# Patient Record
Sex: Female | Born: 1965 | Race: White | Hispanic: No | Marital: Married | Smoking: Never smoker
Health system: Southern US, Community
[De-identification: ages and names within clinical notes are randomized; demographics above are authoritative.]

## PROBLEM LIST (undated history)

## (undated) DIAGNOSIS — I10 Essential (primary) hypertension: Secondary | ICD-10-CM

## (undated) DIAGNOSIS — Z9889 Other specified postprocedural states: Secondary | ICD-10-CM

## (undated) DIAGNOSIS — R112 Nausea with vomiting, unspecified: Secondary | ICD-10-CM

## (undated) HISTORY — PX: ABDOMINAL HYSTERECTOMY: SHX81

## (undated) HISTORY — PX: MYOMECTOMY: SHX85

---

## 1999-02-28 ENCOUNTER — Other Ambulatory Visit: Admission: RE | Admit: 1999-02-28 | Discharge: 1999-02-28 | Payer: Self-pay | Admitting: Obstetrics and Gynecology

## 2000-05-19 ENCOUNTER — Other Ambulatory Visit: Admission: RE | Admit: 2000-05-19 | Discharge: 2000-05-19 | Payer: Self-pay | Admitting: Obstetrics and Gynecology

## 2001-07-27 ENCOUNTER — Other Ambulatory Visit: Admission: RE | Admit: 2001-07-27 | Discharge: 2001-07-27 | Payer: Self-pay | Admitting: Obstetrics and Gynecology

## 2002-12-22 ENCOUNTER — Other Ambulatory Visit: Admission: RE | Admit: 2002-12-22 | Discharge: 2002-12-22 | Payer: Self-pay | Admitting: Obstetrics and Gynecology

## 2004-04-18 ENCOUNTER — Encounter: Admission: RE | Admit: 2004-04-18 | Discharge: 2004-05-23 | Payer: Self-pay | Admitting: Family Medicine

## 2004-11-05 ENCOUNTER — Other Ambulatory Visit: Admission: RE | Admit: 2004-11-05 | Discharge: 2004-11-05 | Payer: Self-pay | Admitting: Obstetrics and Gynecology

## 2008-03-30 ENCOUNTER — Encounter: Admission: RE | Admit: 2008-03-30 | Discharge: 2008-03-30 | Payer: Self-pay | Admitting: Obstetrics and Gynecology

## 2008-07-19 ENCOUNTER — Inpatient Hospital Stay (HOSPITAL_COMMUNITY): Admission: RE | Admit: 2008-07-19 | Discharge: 2008-07-21 | Payer: Self-pay | Admitting: Obstetrics and Gynecology

## 2008-07-19 ENCOUNTER — Encounter (INDEPENDENT_AMBULATORY_CARE_PROVIDER_SITE_OTHER): Payer: Self-pay | Admitting: Obstetrics and Gynecology

## 2008-08-13 ENCOUNTER — Inpatient Hospital Stay (HOSPITAL_COMMUNITY): Admission: AD | Admit: 2008-08-13 | Discharge: 2008-08-17 | Payer: Self-pay | Admitting: Obstetrics and Gynecology

## 2010-11-23 ENCOUNTER — Encounter: Payer: Self-pay | Admitting: Obstetrics and Gynecology

## 2011-03-17 NOTE — Op Note (Signed)
NAMEJERILEE, SPACE [name]:  [id number]   MEDICAL RECORD [name]:  [id number]          PATIENT TYPE:  AMB   LOCATION:  SDC                           FACILITY:  [facility]   PHYSICIAN:  Tammy Munoz. Tammy Munoz, M.D.DATE OF BIRTH:  1966/06/27   DATE OF PROCEDURE:  DATE OF DISCHARGE:                               OPERATIVE REPORT   PREOPERATIVE DIAGNOSIS:  Symptomatic leiomyoma.   POSTOPERATIVE DIAGNOSIS:  Symptomatic leiomyoma.   PROCEDURE:  TAH.   SURGEON:  Tammy Munoz. Tammy Munoz, M.D.   ASSISTANT:  Tammy Cairo, MD   ANESTHESIA:  General.   COMPLICATIONS:  None.   DRAINS:  Foley catheter.   BLOOD LOSS:  300 mL.   PROCEDURE AND FINDINGS:  The patient was taken to the operating room  after an adequate level of general endotracheal anesthesia was obtained  with the patient's legs flat.  The abdomen and vagina were prepped  separately, and she was draped.  Transverse incision made through the  old Pfannenstiel scar prior this Foley catheter was positioned.  Incision carried down to the fascia, which was incised and then extended  transversely.  Rectus muscle divided in the midline.  Peritoneum entered  superiorly without incident and extended in a vertical manner.  Uterus  approximately 16 weeks size with multiple irregular fibroids was  encountered.  The uterus then delivered through the incision.  Both  tubes and ovaries were normal and her desire was to have them conserved.  Large Kelly clamps was then placed to the each utero-ovarian pedicle  starting on the left.  The round ligament was coagulated and divided.  The anterior peritoneum carried around to the opposite round ligament  and the bladder was advanced below with sharp and blunt dissection.  The  utero-ovarian pedicle on the left was then coagulated and divided.  Once  this was separated free, the uterine vasculature pedicle was  skeletonized after assuring that the bladder was well below.  The  uterine artery  was then coagulated and divided close to the uterus.  On  the opposite side, the same procedure performed divided in the round  ligament, securing the right utero-ovarian pedicle, conserving the right  tube and ovary.  The right uterine arteries then coagulated and divided  with the LigaSure.  After this was accomplished, due to the bulk of the  uterus, it was excised to allow better visualization.  This was passed  off the field.  O'Connor-O'Sullivan retractor was then positioned.  Bowels packed superiorly out of the field.  Cervical stump was then  grasped with a tenaculum.  The bladder was dissected below the  cervicovaginal junction.  The remaining pedicles were then clamped,  divided, and suture-ligated with 0-Vicryl suture and the cervix was thus  excised.  Cuff was then closed with interrupted 2-0 Vicryl figure-of-  eight sutures.  The pelvis was then irrigated with saline and aspirated.  The operative site was inspected and noted to be hemostatic.  Prior to  closure sponge, needle, and instrument counts reported as correct x2.  Peritoneum closed with a running 2-0 Vicryl suture.  Rectus muscles  reapproximated with 2-0 Vicryl  interrupted sutures.  Fascia closed from  laterally to midline on either side with 0-PDS suture.  Subfascial and  subcutaneous, On-Q catheter was positioned.  The subcutaneous tissue was  hemostatic.  Clips and Steri-Strips used on the skin along with a  pressure dressing.  Clear urine noted at the end of the case.  She  tolerated this well, went to recovery room in good condition.  Prior to  the procedure x specimens removed, uterine fundus and cervix.      Tammy Munoz, M.D.  Electronically Signed     RMH/MEDQ  D:  07/19/2008  T:  07/19/2008  Job:  [id number]

## 2011-03-17 NOTE — Consult Note (Signed)
NAMEROYAL, BEIRNE [name]:  [id number]   MEDICAL RECORD [name]:  [id number]          PATIENT TYPE:  INP   LOCATION:  9372                          FACILITY:  [facility]   PHYSICIAN:  Lennie Muckle, MD      DATE OF BIRTH:  [birth year]   DATE OF CONSULTATION:  08/13/2008  DATE OF DISCHARGE:                                 CONSULTATION   REASON FOR CONSULTATION:  Small bowel obstruction.   HISTORY OF PRESENT ILLNESS:  Ms. Satterwhite is a 45 year old female who  apparently had a total abdominal hysterectomy on July 19, 2008, for  fibroids.  She had had a viral syndrome approximately 1-2 weeks after  surgery but otherwise had done well until Saturday.  She began having  harsh severe crampy abdominal pain.  She then began having nausea and  vomiting.  She did have crescendo-type of pain.  There were no  alleviating or aggravating factors.  The pain and emesis persisted today  with 3 episodes of emesis.  No bowel movement or flatus since Saturday.  She did have some abdominal distention previously.  As described by her  husband, she did have a few crampy pains about 2 weeks ago. No chills,  no fevers, and has not noticed any abnormal swelling in her abdomen  other than her previous umbilical hernia which has been present.  She  had a KUB which reveled small bowel loops.  A CT scan was performed  which revealed a small bowel obstruction.  There was a question of  hernia near the incision site.  I reviewed this.  There was not a  significant amount of fluid.  There was an umbilical hernia containing  fat.   PAST MEDICAL HISTORY:  She had had fibroids.  Takes no medicines.   PAST SURGICAL HISTORY:  Myomectomy and then she recently had a total  abdominal hysterectomy.   MEDICATIONS:  Iron.   ALLERGIES:  PENICILLIN causes rash.   SOCIAL HISTORY:  No tobacco or alcohol use.  She is married.   FAMILY HISTORY:  Noncontributory.   PHYSICAL EXAMINATION:  GENERAL:  She is a  well-developed, well-nourished  female in no acute distress.  VITAL SIGNS:  Temperature 97.7, blood pressure 153/93, heart rate 91.  HEENT:  Head is normocephalic.  Extraocular muscles are intact.  CHEST:  Clear to auscultation bilaterally.  CARDIOVASCULAR:  Regular, rate, and rhythm.  ABDOMEN:  Soft, nontender, and nondistended and appropriate incisional  tenderness at her lower abdominal scar.  There is an umbilical hernia  which is noted.  No appreciable hernia in and around the incision and  her lower abdomen.  No bowel sounds are auscultated.  CT scan is  reviewed by me.  There are dilated loops of intestine.  I do not see a  hernia down the incision in the lower abdomen.  There is an umbilical  hernia containing fat.  No significant amount of fluid within the  abdominal cavity.   LABORATORY DATA:  She has had a white count which was normal at 10,  hemoglobin and hematocrit 12 and 40.  Sodium 134, potassium 3.4, BUN  and  creatinine 16 and 1.3.   ASSESSMENT AND PLAN:  Small bowel obstruction, likely from adhesion of  tissue.  I do not see significant finding on her CT or examination for  an incisional hernia.  I would recommend NG tube decompression with  bowel rest.  Would like her ambulate as much as possible.  She can have  hard candy p.o. for comfort measures.  At present time, there is no  significant findings worrisome to indicate operative management as  needed acutely.  I discussed with Ms. Tapanes and her husband that  normally I would give time to allow for this to resolve.  Hopefully, she  will begin to open with the decompression and not need surgical  management, however, she does not have an improvement over the next 5-7  days, she may need to have an intervention.  I have discussed this with  Dr. Henderson Cloud and we will continue to follow the patient.      Lennie Muckle, MD  Electronically Signed     ALA/MEDQ  D:  08/13/2008  T:  08/14/2008  Job:  [id number]

## 2011-03-17 NOTE — Discharge Summary (Signed)
[name], Tammy [name]:  [id number]   MEDICAL RECORD [name]:  [id number]          PATIENT TYPE:  INP   LOCATION:  9317                          FACILITY:  [facility]   PHYSICIAN:  Duke Salvia. Marcelle Overlie, M.D.DATE OF BIRTH:  [birth year]   DATE OF ADMISSION:  07/19/2008  DATE OF DISCHARGE:  07/21/2008                               DISCHARGE SUMMARY   DISCHARGE DIAGNOSES:  1. Symptomatic leiomyoma.  2. Total abdominal hysterectomy this admission.   SUMMARY OF THE HISTORY AND PHYSICAL EXAM:  Please see admission H and P  for details.  Briefly, a 45 year old with large symptomatic leiomyoma  and anemia presents for definitive hysterectomy.   HOSPITAL COURSE:  On July 19, 2008, under general anesthesia, the  patient underwent laparotomy with TAH.  Her preop hemoglobin was 8.4.  On the first postop day, catheter was removed.  Her diet was advanced  and she was ambulating.  Her postop day #1 hemoglobin was 6.5.  She was  ambulating without orthostatic concerns at that point.  Second postop  day, followup hemoglobin 6.0.  Her abdominal exam was unremarkable.  The  incision was clean and dry.  On-Q catheters were removed.  Again, she  was ambulating without significant orthostasis as iron had been started.  She was ready for discharge at that point.   LAB DATA:  CBC on admission, July 17, 2008, WBC is 6.5, hemoglobin  8.4, hematocrit 27.2, platelets 453,000, blood type is A positive,  antibody screen negative, and EPT was negative.  CBC on July 20, 2008, WBC 12.4, hemoglobin 6.5, hematocrit 20.8, hemoglobin on July 21, 2008, 6.0.   DISPOSITION:  The patient was discharged on Ferralet 90 once daily,  Motrin 800 mg 1 p.o. q.12 h. p.r.n. pain, and Tylox p.r.n. pain.  Advised to report any incisional redness or drainage, increased pain or  bleeding, or fever over 101.  Was given specific instructions regarding  diet besides exercise and to be observant of any  significant orthostatic  changes.  I will see her back in the office in 3-4 days for an  incisional check and to have the clips removed.   CONDITION:  Good.   ACTIVITY:  Graded increase.      Richard M. Marcelle Overlie, M.D.  Electronically Signed     RMH/MEDQ  D:  07/21/2008  T:  07/22/2008  Job:  [id number]

## 2011-03-17 NOTE — H&P (Signed)
NAMETAMBI, THOLE [name]:  [id number]   MEDICAL RECORD [name]:  [id number]          PATIENT TYPE:  AMB   LOCATION:  SDC                           [facility]   PHYSICIAN:  Duke Salvia. Marcelle Overlie, M.D.DATE OF BIRTH:  [birth year]   DATE OF ADMISSION:  DATE OF DISCHARGE:                              HISTORY & PHYSICAL   CHIEF COMPLAINT:  Symptomatic leiomyoma.   HISTORY OF PRESENT ILLNESS:  A 45 year old G3, P2, 2 previous cesarean  sections, currently on Ortho-Novum 7/7/7, who has had worsening problems  with menorrhagia and increased pelvic pain associated with leiomyoma.  In 1995, she underwent myomectomy, since had 2 cesarean sections with  lysis of adhesions.   Most recent ultrasound as her symptoms worsened on April 2008  demonstrated multiple fibroids 6.7, 6.0, 5.1, 4.8, 4.6, and smaller.  She presents now for definitive TAH.  Procedure including risks related  to bleeding, infection, adjacent organ injury, the possible need for USO  if significant unilateral adnexal adhesions for example are encountered  and reviewed with her.   Other risks related to blood transfusion, wound infection, phlebitis  along with her expected recovery time, all reviewed which she  understands and accepts.   PAST MEDICAL HISTORY:   ALLERGIES:  PENICILLIN.   OPERATIONS:  Myomectomy, cesarean section x2.   FAMILY HISTORY:  Significant for history of diabetes and asthma.   PHYSICAL EXAMINATION:  VITAL SIGNS:  Temperature 98.2 and blood pressure  120/78.  HEENT:  Unremarkable.  NECK:  Supple without masses.  LUNGS:  Clear.  CARDIOVASCULAR:  Regular rate and rhythm without murmurs, rubs, or  gallops.  BREASTS:  Without masses.  ABDOMEN:  Soft and nontender.  PELVIC:  Normal external genitalia.  Vagina and cervix were clear.  Uterus was 15-16 weeks size.  Adnexa unremarkable.  EXTREMITIES:  Unremarkable  NEUROLOGIC:  Unremarkable.   IMPRESSION:  Symptomatic leiomyoma.   PLAN:  TAH.  Procedure and risks reviewed as above, full bowel prep has  been ordered preop.      Richard M. Marcelle Overlie, M.D.  Electronically Signed     RMH/MEDQ  D:  07/16/2008  T:  07/17/2008  Job:  [id number]

## 2011-03-17 NOTE — Discharge Summary (Signed)
[name], ZANDERS [name]:  [id number]   MEDICAL RECORD [name]:  [id number]          PATIENT TYPE:  INP   LOCATION:  9302                          [facility]ILITY[facility]   PHYSICIAN:  Duke Salvia. Marcelle Overlie, M.D.DATE OF BIRTH:  May 24, 1966   DATE OF ADMISSION:  08/13/2008  DATE OF DISCHARGE:  08/17/2008                               DISCHARGE SUMMARY   DISCHARGE DIAGNOSIS:  Postoperative small bowel obstruction, resolved.   Summary of the history and physical exam, please see admission H and P  for details. Briefly, 45 year old white female status post TAH July 19, 2008, approximately 3 weeks postop.  A week ago, she did have a  viral syndrome with viral gastroenteritis with spontaneous improvement  on the day of admission; however experienced, nausea, vomiting, that was  worsening with increasing abdominal pain and was sent to MAU for further  evaluation.   HOSPITAL COURSE:  An MAU was noted to have a temperature of 98.2,  decreased bowel sounds were noted with distention, WBC 10.0, hemoglobin  12.6.  Flattened upright abdominal film showed the SBO.  CT was ordered  showing high-grade S B O.  She was admitted for further management  including consultation with general surgery who recommended conservative  approach initially with nasal decompression.  Her NG tube produced  fairly immediate results with marked improvement in her abdominal  cramping.  Over the next few days, she had spontaneous flatus and bowel  movements.  Remained afebrile with an improving abdominal exam.  On the  followup abdominal film dated August 15, 2008, she had improving small  bowel obstruction since the previous exam.  NG tube was placed to  straight drain, was tolerated well was removed 2 days prior to  discharge.  The day prior to discharge diet was advanced.  She was  tolerating a regular diet in the morning of August 17, 2008.  No  abdominal complaints and was ready for discharge at that  point.   LABORATORY DATA:  CBC August 13, 2008, WBC 10.9, hemoglobin 12.6,  hematocrit 40.7, platelet 615,000.  CMET was normal.  UA negative.  Admission CT high-grade bowel small-bowel obstruction with transition  point in the pelvis.  No evidence of pneumoperitoneum.   DISPOSITION:  The patient discharged on regular diet.  She is taking  Motrin p.r.n. pain only advised to report any recurrent symptoms,  nausea, vomiting, abdominal pain, or obstipation.  She will return the  office in 1 week, other normal postop hysterectomy instructions were  reviewed.  Condition is good.   ACTIVITY:  Gradual increase.      Richard M. Marcelle Overlie, M.D.  Electronically Signed     RMH/MEDQ  D:  08/17/2008  T:  08/17/2008  Job:  [id number]

## 2011-08-03 LAB — CBC
HCT: 27.2 — ABNORMAL LOW
Hemoglobin: 6 — CL
Hemoglobin: 8.4 — ABNORMAL LOW
MCHC: 31.1
MCV: 64.9 — ABNORMAL LOW
Platelets: 386
Platelets: 416 — ABNORMAL HIGH
RBC: 2.96 — ABNORMAL LOW
RDW: 18.1 — ABNORMAL HIGH
WBC: 6.5
WBC: 7.2

## 2011-08-03 LAB — ABO/RH: ABO/RH(D): A POS

## 2011-08-03 LAB — TYPE AND SCREEN
ABO/RH(D): A POS
Antibody Screen: NEGATIVE

## 2011-08-03 LAB — PREGNANCY, URINE: Preg Test, Ur: NEGATIVE

## 2011-08-04 LAB — URINALYSIS, ROUTINE W REFLEX MICROSCOPIC
Hgb urine dipstick: NEGATIVE
Ketones, ur: 15 — AB
Leukocytes, UA: NEGATIVE
Nitrite: NEGATIVE
Protein, ur: 30 — AB
Urobilinogen, UA: 0.2
pH: 5

## 2011-08-04 LAB — COMPREHENSIVE METABOLIC PANEL
ALT: 24
AST: 19
Albumin: 4.1
BUN: 16
CO2: 30
Calcium: 9.4
GFR calc Af Amer: >60
GFR calc non Af Amer: 53 — ABNORMAL LOW
Potassium: 3.7
Potassium: 3.9
Sodium: 134 — ABNORMAL LOW
Total Bilirubin: 0.6
Total Bilirubin: 0.7

## 2011-08-04 LAB — CBC
Hemoglobin: 12.6
MCHC: 31.1
MCV: 72.4 — ABNORMAL LOW
MCV: 72.6 — ABNORMAL LOW
RBC: 5.15 — ABNORMAL HIGH
RDW: 27.7 — ABNORMAL HIGH
WBC: 10
WBC: 6

## 2011-08-04 LAB — URINE MICROSCOPIC-ADD ON

## 2016-04-17 DIAGNOSIS — B373 Candidiasis of vulva and vagina: Secondary | ICD-10-CM | POA: Diagnosis not present

## 2016-04-17 DIAGNOSIS — I10 Essential (primary) hypertension: Secondary | ICD-10-CM | POA: Diagnosis not present

## 2016-04-17 DIAGNOSIS — N951 Menopausal and female climacteric states: Secondary | ICD-10-CM | POA: Diagnosis not present

## 2016-06-23 DIAGNOSIS — Z1231 Encounter for screening mammogram for malignant neoplasm of breast: Secondary | ICD-10-CM | POA: Diagnosis not present

## 2016-06-23 DIAGNOSIS — Z13820 Encounter for screening for osteoporosis: Secondary | ICD-10-CM | POA: Diagnosis not present

## 2016-06-23 DIAGNOSIS — Z01419 Encounter for gynecological examination (general) (routine) without abnormal findings: Secondary | ICD-10-CM | POA: Diagnosis not present

## 2016-06-23 DIAGNOSIS — Z6829 Body mass index (BMI) 29.0-29.9, adult: Secondary | ICD-10-CM | POA: Diagnosis not present

## 2016-06-29 ENCOUNTER — Other Ambulatory Visit: Payer: Self-pay | Admitting: Obstetrics and Gynecology

## 2016-06-29 DIAGNOSIS — R928 Other abnormal and inconclusive findings on diagnostic imaging of breast: Secondary | ICD-10-CM

## 2016-07-02 ENCOUNTER — Ambulatory Visit
Admission: RE | Admit: 2016-07-02 | Discharge: 2016-07-02 | Disposition: A | Discharge status: Home / Self Care | Payer: BLUE CROSS/BLUE SHIELD | Source: Ambulatory Visit | Attending: Obstetrics and Gynecology | Admitting: Obstetrics and Gynecology

## 2016-07-02 ENCOUNTER — Other Ambulatory Visit: Payer: Self-pay | Admitting: Obstetrics and Gynecology

## 2016-07-02 DIAGNOSIS — N63 Unspecified lump in breast: Secondary | ICD-10-CM | POA: Diagnosis not present

## 2016-07-02 DIAGNOSIS — R928 Other abnormal and inconclusive findings on diagnostic imaging of breast: Secondary | ICD-10-CM

## 2016-07-02 DIAGNOSIS — R921 Mammographic calcification found on diagnostic imaging of breast: Secondary | ICD-10-CM | POA: Diagnosis not present

## 2016-07-02 DIAGNOSIS — N6320 Unspecified lump in the left breast, unspecified quadrant: Secondary | ICD-10-CM

## 2016-07-03 ENCOUNTER — Other Ambulatory Visit: Payer: Self-pay | Admitting: Obstetrics and Gynecology

## 2016-07-03 DIAGNOSIS — N6320 Unspecified lump in the left breast, unspecified quadrant: Secondary | ICD-10-CM

## 2016-07-07 ENCOUNTER — Ambulatory Visit
Admission: RE | Admit: 2016-07-07 | Discharge: 2016-07-07 | Disposition: A | Discharge status: Home / Self Care | Payer: BLUE CROSS/BLUE SHIELD | Source: Ambulatory Visit | Attending: Obstetrics and Gynecology | Admitting: Obstetrics and Gynecology

## 2016-07-07 DIAGNOSIS — D242 Benign neoplasm of left breast: Secondary | ICD-10-CM | POA: Diagnosis not present

## 2016-07-07 DIAGNOSIS — N6320 Unspecified lump in the left breast, unspecified quadrant: Secondary | ICD-10-CM

## 2016-07-07 DIAGNOSIS — N63 Unspecified lump in breast: Secondary | ICD-10-CM | POA: Diagnosis not present

## 2016-07-21 ENCOUNTER — Ambulatory Visit: Payer: Self-pay | Admitting: General Surgery

## 2016-07-21 DIAGNOSIS — D242 Benign neoplasm of left breast: Secondary | ICD-10-CM | POA: Diagnosis not present

## 2016-07-30 ENCOUNTER — Other Ambulatory Visit: Payer: Self-pay | Admitting: General Surgery

## 2016-07-30 DIAGNOSIS — D242 Benign neoplasm of left breast: Secondary | ICD-10-CM

## 2016-08-04 ENCOUNTER — Encounter (HOSPITAL_BASED_OUTPATIENT_CLINIC_OR_DEPARTMENT_OTHER): Payer: Self-pay | Admitting: *Deleted

## 2016-08-05 DIAGNOSIS — Z23 Encounter for immunization: Secondary | ICD-10-CM | POA: Diagnosis not present

## 2016-08-06 ENCOUNTER — Encounter (HOSPITAL_BASED_OUTPATIENT_CLINIC_OR_DEPARTMENT_OTHER)
Admission: RE | Admit: 2016-08-06 | Discharge: 2016-08-06 | Disposition: A | Discharge status: Home / Self Care | Payer: BLUE CROSS/BLUE SHIELD | Source: Ambulatory Visit | Attending: General Surgery | Admitting: General Surgery

## 2016-08-06 ENCOUNTER — Other Ambulatory Visit: Payer: Self-pay

## 2016-08-06 DIAGNOSIS — I10 Essential (primary) hypertension: Secondary | ICD-10-CM | POA: Insufficient documentation

## 2016-08-06 NOTE — Pre-Procedure Instructions (Signed)
Boost Breeze 8 oz. given to pt. with instructions to drink by 0800 DOS.

## 2016-08-18 ENCOUNTER — Ambulatory Visit
Admission: RE | Admit: 2016-08-18 | Discharge: 2016-08-18 | Disposition: A | Discharge status: Home / Self Care | Payer: BLUE CROSS/BLUE SHIELD | Source: Ambulatory Visit | Attending: General Surgery | Admitting: General Surgery

## 2016-08-18 DIAGNOSIS — D242 Benign neoplasm of left breast: Secondary | ICD-10-CM

## 2016-08-19 ENCOUNTER — Ambulatory Visit (HOSPITAL_BASED_OUTPATIENT_CLINIC_OR_DEPARTMENT_OTHER)
Admission: RE | Admit: 2016-08-19 | Discharge: 2016-08-19 | Disposition: A | Discharge status: Home / Self Care | Payer: BLUE CROSS/BLUE SHIELD | Source: Ambulatory Visit | Attending: General Surgery | Admitting: General Surgery

## 2016-08-19 ENCOUNTER — Ambulatory Visit (HOSPITAL_BASED_OUTPATIENT_CLINIC_OR_DEPARTMENT_OTHER): Payer: BLUE CROSS/BLUE SHIELD | Admitting: Anesthesiology

## 2016-08-19 ENCOUNTER — Ambulatory Visit
Admission: RE | Admit: 2016-08-19 | Discharge: 2016-08-19 | Disposition: A | Discharge status: Home / Self Care | Payer: BLUE CROSS/BLUE SHIELD | Source: Ambulatory Visit | Attending: General Surgery | Admitting: General Surgery

## 2016-08-19 ENCOUNTER — Encounter (HOSPITAL_BASED_OUTPATIENT_CLINIC_OR_DEPARTMENT_OTHER)
Admission: RE | Disposition: A | Discharge status: Home / Self Care | Payer: Self-pay | Source: Ambulatory Visit | Attending: General Surgery

## 2016-08-19 ENCOUNTER — Encounter (HOSPITAL_BASED_OUTPATIENT_CLINIC_OR_DEPARTMENT_OTHER): Payer: Self-pay | Admitting: *Deleted

## 2016-08-19 DIAGNOSIS — N6022 Fibroadenosis of left breast: Secondary | ICD-10-CM | POA: Insufficient documentation

## 2016-08-19 DIAGNOSIS — D242 Benign neoplasm of left breast: Secondary | ICD-10-CM | POA: Diagnosis not present

## 2016-08-19 DIAGNOSIS — I10 Essential (primary) hypertension: Secondary | ICD-10-CM | POA: Insufficient documentation

## 2016-08-19 DIAGNOSIS — N6092 Unspecified benign mammary dysplasia of left breast: Secondary | ICD-10-CM | POA: Insufficient documentation

## 2016-08-19 DIAGNOSIS — Z803 Family history of malignant neoplasm of breast: Secondary | ICD-10-CM | POA: Diagnosis not present

## 2016-08-19 DIAGNOSIS — Z90710 Acquired absence of both cervix and uterus: Secondary | ICD-10-CM | POA: Diagnosis not present

## 2016-08-19 DIAGNOSIS — Z79899 Other long term (current) drug therapy: Secondary | ICD-10-CM | POA: Diagnosis not present

## 2016-08-19 DIAGNOSIS — N6082 Other benign mammary dysplasias of left breast: Secondary | ICD-10-CM | POA: Diagnosis not present

## 2016-08-19 DIAGNOSIS — N6012 Diffuse cystic mastopathy of left breast: Secondary | ICD-10-CM | POA: Diagnosis not present

## 2016-08-19 HISTORY — PX: BREAST LUMPECTOMY WITH RADIOACTIVE SEED LOCALIZATION: SHX6424

## 2016-08-19 HISTORY — DX: Essential (primary) hypertension: I10

## 2016-08-19 HISTORY — DX: Other specified postprocedural states: Z98.890

## 2016-08-19 HISTORY — DX: Nausea with vomiting, unspecified: R11.2

## 2016-08-19 SURGERY — BREAST LUMPECTOMY WITH RADIOACTIVE SEED LOCALIZATION
Anesthesia: General | Site: Breast | Laterality: Left

## 2016-08-19 MED ORDER — CHLORHEXIDINE GLUCONATE CLOTH 2 % EX PADS: 6.0000 | MEDICATED_PAD | Freq: Once | CUTANEOUS | Status: DC

## 2016-08-19 MED ORDER — SCOPOLAMINE 1 MG/3DAYS TD PT72: 1.0000 | MEDICATED_PATCH | Freq: Once | TRANSDERMAL | Status: DC | PRN

## 2016-08-19 MED ORDER — FENTANYL CITRATE (PF) 100 MCG/2ML IJ SOLN
INTRAMUSCULAR | Status: AC
  Filled 2016-08-19: qty 2

## 2016-08-19 MED ORDER — LACTATED RINGERS IV SOLN: INTRAVENOUS | Status: DC

## 2016-08-19 MED ORDER — VANCOMYCIN HCL IN DEXTROSE 1-5 GM/200ML-% IV SOLN
INTRAVENOUS | Status: AC
  Filled 2016-08-19: qty 200

## 2016-08-19 MED ORDER — MIDAZOLAM HCL 2 MG/2ML IJ SOLN
INTRAMUSCULAR | Status: AC
  Filled 2016-08-19: qty 2

## 2016-08-19 MED ORDER — PROMETHAZINE HCL 25 MG/ML IJ SOLN: 6.2500 mg | INTRAMUSCULAR | Status: DC | PRN

## 2016-08-19 MED ORDER — FENTANYL CITRATE (PF) 100 MCG/2ML IJ SOLN
50.0000 ug | INTRAMUSCULAR | Status: AC | PRN
  Administered 2016-08-19 (×2): 50 ug via INTRAVENOUS
  Administered 2016-08-19: 100 ug via INTRAVENOUS

## 2016-08-19 MED ORDER — ONDANSETRON HCL 4 MG/2ML IJ SOLN
INTRAMUSCULAR | Status: DC | PRN
  Administered 2016-08-19: 4 mg via INTRAVENOUS

## 2016-08-19 MED ORDER — LIDOCAINE HCL (CARDIAC) 20 MG/ML IV SOLN
INTRAVENOUS | Status: DC | PRN
  Administered 2016-08-19: 100 mg via INTRAVENOUS

## 2016-08-19 MED ORDER — BUPIVACAINE HCL (PF) 0.25 % IJ SOLN
INTRAMUSCULAR | Status: AC
Start: 2016-08-19 — End: 2016-08-19
  Filled 2016-08-19: qty 30

## 2016-08-19 MED ORDER — DEXAMETHASONE SODIUM PHOSPHATE 4 MG/ML IJ SOLN
INTRAMUSCULAR | Status: DC | PRN
  Administered 2016-08-19: 10 mg via INTRAVENOUS

## 2016-08-19 MED ORDER — OXYCODONE HCL 5 MG/5ML PO SOLN: 5.0000 mg | Freq: Once | ORAL | Status: DC | PRN

## 2016-08-19 MED ORDER — VANCOMYCIN HCL IN DEXTROSE 1-5 GM/200ML-% IV SOLN
1000.0000 mg | INTRAVENOUS | Status: AC
  Administered 2016-08-19: 1000 mg via INTRAVENOUS

## 2016-08-19 MED ORDER — MEPERIDINE HCL 25 MG/ML IJ SOLN: 6.2500 mg | INTRAMUSCULAR | Status: DC | PRN

## 2016-08-19 MED ORDER — MIDAZOLAM HCL 2 MG/2ML IJ SOLN
1.0000 mg | INTRAMUSCULAR | Status: DC | PRN
  Administered 2016-08-19: 2 mg via INTRAVENOUS

## 2016-08-19 MED ORDER — GLYCOPYRROLATE 0.2 MG/ML IJ SOLN: 0.2000 mg | Freq: Once | INTRAMUSCULAR | Status: DC | PRN

## 2016-08-19 MED ORDER — BUPIVACAINE HCL (PF) 0.25 % IJ SOLN
INTRAMUSCULAR | Status: DC | PRN
  Administered 2016-08-19: 20 mL

## 2016-08-19 MED ORDER — LACTATED RINGERS IV SOLN
INTRAVENOUS | Status: DC
Start: 2016-08-19 — End: 2016-08-19
  Administered 2016-08-19 (×2): via INTRAVENOUS

## 2016-08-19 MED ORDER — SUCCINYLCHOLINE CHLORIDE 20 MG/ML IJ SOLN
INTRAMUSCULAR | Status: DC | PRN
  Administered 2016-08-19: 60 mg via INTRAVENOUS

## 2016-08-19 MED ORDER — HYDROMORPHONE HCL 1 MG/ML IJ SOLN: 0.2500 mg | INTRAMUSCULAR | Status: DC | PRN

## 2016-08-19 MED ORDER — PROPOFOL 10 MG/ML IV BOLUS
INTRAVENOUS | Status: DC | PRN
  Administered 2016-08-19: 200 mg via INTRAVENOUS
  Administered 2016-08-19 (×2): 50 mg via INTRAVENOUS

## 2016-08-19 MED ORDER — OXYCODONE HCL 5 MG PO TABS: 5.0000 mg | ORAL_TABLET | Freq: Once | ORAL | Status: DC | PRN

## 2016-08-19 MED ORDER — OXYCODONE-ACETAMINOPHEN 5-325 MG PO TABS: 1.0000 | ORAL_TABLET | ORAL | 0 refills | Status: AC | PRN

## 2016-08-19 SURGICAL SUPPLY — 42 items
APPLIER CLIP 9.375 MED OPEN (MISCELLANEOUS)
BLADE SURG 15 STRL LF DISP TIS (BLADE) ×1 IMPLANT
BLADE SURG 15 STRL SS (BLADE) ×1
CANISTER SUC SOCK COL 7IN (MISCELLANEOUS) ×2 IMPLANT
CANISTER SUCT 1200ML W/VALVE (MISCELLANEOUS) ×2 IMPLANT
CHLORAPREP W/TINT 26ML (MISCELLANEOUS) ×2 IMPLANT
CLIP APPLIE 9.375 MED OPEN (MISCELLANEOUS) IMPLANT
COVER BACK TABLE 60X90IN (DRAPES) ×2 IMPLANT
COVER MAYO STAND STRL (DRAPES) ×2 IMPLANT
COVER PROBE W GEL 5X96 (DRAPES) ×2 IMPLANT
DECANTER SPIKE VIAL GLASS SM (MISCELLANEOUS) IMPLANT
DERMABOND ADVANCED (GAUZE/BANDAGES/DRESSINGS) ×1
DERMABOND ADVANCED .7 DNX12 (GAUZE/BANDAGES/DRESSINGS) ×1 IMPLANT
DEVICE DUBIN W/COMP PLATE 8390 (MISCELLANEOUS) ×2 IMPLANT
DRAPE LAPAROSCOPIC ABDOMINAL (DRAPES) IMPLANT
DRAPE UTILITY XL STRL (DRAPES) ×2 IMPLANT
ELECT COATED BLADE 2.86 ST (ELECTRODE) ×2 IMPLANT
ELECT REM PT RETURN 9FT ADLT (ELECTROSURGICAL) ×2
ELECTRODE REM PT RTRN 9FT ADLT (ELECTROSURGICAL) ×1 IMPLANT
GLOVE BIO SURGEON STRL SZ7.5 (GLOVE) ×4 IMPLANT
GLOVE BIOGEL PI IND STRL 7.0 (GLOVE) ×1 IMPLANT
GLOVE BIOGEL PI INDICATOR 7.0 (GLOVE) ×1
GLOVE ECLIPSE 6.5 STRL STRAW (GLOVE) ×2 IMPLANT
GOWN STRL REUS W/ TWL LRG LVL3 (GOWN DISPOSABLE) ×2 IMPLANT
GOWN STRL REUS W/TWL LRG LVL3 (GOWN DISPOSABLE) ×2
ILLUMINATOR WAVEGUIDE N/F (MISCELLANEOUS) IMPLANT
KIT MARKER MARGIN INK (KITS) ×2 IMPLANT
LIGHT WAVEGUIDE WIDE FLAT (MISCELLANEOUS) IMPLANT
NEEDLE HYPO 25X1 1.5 SAFETY (NEEDLE) IMPLANT
NS IRRIG 1000ML POUR BTL (IV SOLUTION) IMPLANT
PACK BASIN DAY SURGERY FS (CUSTOM PROCEDURE TRAY) ×2 IMPLANT
PENCIL BUTTON HOLSTER BLD 10FT (ELECTRODE) ×2 IMPLANT
SLEEVE SCD COMPRESS KNEE MED (MISCELLANEOUS) ×2 IMPLANT
SPONGE LAP 18X18 X RAY DECT (DISPOSABLE) ×2 IMPLANT
SUT MON AB 4-0 PC3 18 (SUTURE) IMPLANT
SUT SILK 2 0 SH (SUTURE) IMPLANT
SUT VICRYL 3-0 CR8 SH (SUTURE) ×2 IMPLANT
SYR CONTROL 10ML LL (SYRINGE) IMPLANT
TOWEL OR 17X24 6PK STRL BLUE (TOWEL DISPOSABLE) ×2 IMPLANT
TOWEL OR NON WOVEN STRL DISP B (DISPOSABLE) ×2 IMPLANT
TUBE CONNECTING 20X1/4 (TUBING) ×2 IMPLANT
YANKAUER SUCT BULB TIP NO VENT (SUCTIONS) IMPLANT

## 2016-08-19 NOTE — Op Note (Signed)
08/19/2016  12:06 PM  PATIENT:  Tammy Munoz  50 y.o. female  PRE-OPERATIVE DIAGNOSIS:  LEFT BREAST PAPILLOMA  POST-OPERATIVE DIAGNOSIS:  LEFT BREAST PAPILLOMA  PROCEDURE:  Procedure(s): LEFT BREAST LUMPECTOMY WITH RADIOACTIVE SEED LOCALIZATION (Left)  SURGEON:  Surgeon(s) and Role:    * Jovita Kussmaul, MD - Primary  PHYSICIAN ASSISTANT:   ASSISTANTS: none   ANESTHESIA:   local and general  EBL:  Total I/O In: 700 [I.V.:700] Out: 5 [Blood:5]  BLOOD ADMINISTERED:none  DRAINS: none   LOCAL MEDICATIONS USED:  MARCAINE     SPECIMEN:  Source of Specimen:  left breast tissue  DISPOSITION OF SPECIMEN:  PATHOLOGY  COUNTS:  YES  TOURNIQUET:  * No tourniquets in log *  DICTATION: .Dragon Dictation   After informed consent was obtained the patient was brought to the operating room and placed in the supine position on the operating room table. After adequate induction of general anesthesia the patient's left breast was prepped with ChloraPrep, allowed to dry, and draped in usual sterile manner. An appropriate timeout was performed. Previously an I-125 seed was placed in the outer aspect of the left breast to mark an area of an intraductal papilloma. The neoprobe was set to I-125 in the area of radioactivity was readily identified in the central lateral left breast. The area around the radioactivity was infiltrated with quarter percent Marcaine. A curvilinear incision was made with a 15 blade knife along the outer edge of the areola. The incision was carried through skin and subcutaneous tissue sharply with the electrocautery. The dissection was directed by the neoprobe. Once we approached the radioactive seed I then checked the area of radioactivity frequently and removed a circular portion of breast tissue sharply around the radioactive seed. Once the specimen was removed it was oriented with the appropriate paint colors. A specimen radiograph was obtained that showed the clip  in seed to be near the center of the specimen. The specimen was then sent to pathology for further evaluation. Hemostasis was achieved using the Bovie electrocautery. The wound was then infiltrated with quarter percent Marcaine and irrigated with saline. The deep layer of the wound was then closed with layers of interrupted 3-0 Vicryl stitches. The skin was then closed with interrupted 4-0 Monocryl subcuticular stitches. Dermabond dressings were applied. The patient tolerated the procedure well. At the end of the case all needle sponge counts were correct. The patient was then awakened and taken to recovery in stable condition.  PLAN OF CARE: Discharge to home after PACU  PATIENT DISPOSITION:  PACU - hemodynamically stable.   Delay start of Pharmacological VTE agent (>24hrs) due to surgical blood loss or risk of bleeding: not applicable

## 2016-08-19 NOTE — Anesthesia Preprocedure Evaluation (Addendum)
Anesthesia Evaluation  Patient identified by MRN, date of birth, ID band Patient awake    Reviewed: Allergy & Precautions, NPO status , Patient's Chart, lab work & pertinent test results  History of Anesthesia Complications (+) PONV and history of anesthetic complications  Airway Mallampati: II  TM Distance: >3 FB Neck ROM: Full    Dental  (+) Teeth Intact, Dental Advisory Given   Pulmonary neg pulmonary ROS,    breath sounds clear to auscultation       Cardiovascular hypertension, Pt. on medications  Rhythm:Regular Rate:Normal     Neuro/Psych negative neurological ROS  negative psych ROS   GI/Hepatic negative GI ROS, Neg liver ROS,   Endo/Other  negative endocrine ROS  Renal/GU negative Renal ROS  negative genitourinary   Musculoskeletal negative musculoskeletal ROS (+)   Abdominal   Peds negative pediatric ROS (+)  Hematology negative hematology ROS (+)   Anesthesia Other Findings   Reproductive/Obstetrics negative OB ROS                            Lab Results  Component Value Date   WBC 6.0 08/14/2008   HGB 11.6 (L) 08/14/2008   HCT 37.3 08/14/2008   MCV 72.4 (L) 08/14/2008   PLT 514 (H) 08/14/2008   Lab Results  Component Value Date   CREATININE 1.00 08/14/2008   BUN 16 08/14/2008   NA 134 (L) 08/14/2008   K 3.9 08/14/2008   CL 92 (L) 08/14/2008   CO2 30 08/14/2008   No results found for: INR, PROTIME  08/2016 EKG: normal sinus rhythm.  Anesthesia Physical Anesthesia Plan  ASA: II  Anesthesia Plan: General   Post-op Pain Management:    Induction: Intravenous  Airway Management Planned: LMA  Additional Equipment:   Intra-op Plan:   Post-operative Plan: Extubation in OR  Informed Consent: I have reviewed the patients History and Physical, chart, labs and discussed the procedure including the risks, benefits and alternatives for the proposed anesthesia  with the patient or authorized representative who has indicated his/her understanding and acceptance.   Dental advisory given  Plan Discussed with: CRNA  Anesthesia Plan Comments:         Anesthesia Quick Evaluation

## 2016-08-19 NOTE — Anesthesia Postprocedure Evaluation (Signed)
Anesthesia Post Note  Patient: Tammy Munoz  Procedure(s) Performed: Procedure(s) (LRB): LEFT BREAST LUMPECTOMY WITH RADIOACTIVE SEED LOCALIZATION (Left)  Patient location during evaluation: PACU Anesthesia Type: General Level of consciousness: awake and alert Pain management: pain level controlled Vital Signs Assessment: post-procedure vital signs reviewed and stable Respiratory status: spontaneous breathing, nonlabored ventilation, respiratory function stable and patient connected to nasal cannula oxygen Cardiovascular status: blood pressure returned to baseline and stable Postop Assessment: no signs of nausea or vomiting Anesthetic complications: no    Last Vitals:  Vitals:   08/19/16 1245 08/19/16 1253  BP:  134/70  Pulse: 80 78  Resp:    Temp:  36.8 C    Last Pain:  Vitals:   08/19/16 1253  TempSrc:   PainSc: 2                  Effie Berkshire

## 2016-08-19 NOTE — Transfer of Care (Signed)
Immediate Anesthesia Transfer of Care Note  Patient: Tammy Munoz  Procedure(s) Performed: Procedure(s): LEFT BREAST LUMPECTOMY WITH RADIOACTIVE SEED LOCALIZATION (Left)  Patient Location: PACU  Anesthesia Type:General  Level of Consciousness: awake, alert  and oriented  Airway & Oxygen Therapy: Patient Spontanous Breathing and Patient connected to face mask oxygen  Post-op Assessment: Report given to RN and Post -op Vital signs reviewed and stable  Post vital signs: Reviewed and stable  Last Vitals:  Vitals:   08/19/16 1014  BP: (!) 148/79  Pulse: 74  Resp: 20  Temp: 36.9 C    Last Pain:  Vitals:   08/19/16 1014  TempSrc: Oral         Complications: No apparent anesthesia complications

## 2016-08-19 NOTE — Anesthesia Procedure Notes (Signed)
Procedure Name: LMA Insertion Date/Time: 08/19/2016 11:27 AM Performed by: Melynda Ripple D Pre-anesthesia Checklist: Patient identified, Emergency Drugs available, Suction available and Patient being monitored Patient Re-evaluated:Patient Re-evaluated prior to inductionOxygen Delivery Method: Circle system utilized Preoxygenation: Pre-oxygenation with 100% oxygen Intubation Type: IV induction Ventilation: Mask ventilation without difficulty LMA: LMA inserted LMA Size: 4.0 Number of attempts: 1 Airway Equipment and Method: Bite block Placement Confirmation: positive ETCO2 Tube secured with: Tape Dental Injury: Teeth and Oropharynx as per pre-operative assessment

## 2016-08-19 NOTE — H&P (Signed)
Tammy Munoz  Location: [facility] Patient #: [id number] DOB: Sep 01, 1966 Married / Language: English / Race: White Female   History of Present Illness  The patient is a 50 year old female who presents with a breast mass. We are asked to see the patient in consultation by Dr. Dorise Bullion to evaluate her for a left breast papilloma. The patient is a 50 year old white female who recently went for a routine screening mammogram. At that time she was found to have a 1 cm area of distortion in the inner aspect of the left breast. 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 She has had several episodes of clear yellow nipple discharge over the last 6 months. She denies any breast pain. Her only family history of breast cancer is in a maternal aunt. She does not smoke. She does not take any hormone replacement.   Other Problems Back Pain High blood pressure Lump In Breast Oophorectomy  Past Surgical History Breast Biopsy Left. Cesarean Section - Multiple Hysterectomy (not due to cancer) - Complete Oral Surgery  Diagnostic Studies History Colonoscopy never Mammogram within last year Pap Smear 1-5 years ago  Allergies Penicillin VK *PENICILLINS* Hives.  Medication History  Amlodipine Besylate-Valsartan (10-160MG  Tablet, Oral) Active. Estradiol (0.025MG /24HR Patch Weekly, Transdermal) Active. Medications Reconciled  Pregnancy / Birth History Age at menarche 18 years. Contraceptive History Oral contraceptives. Gravida 3 Length (months) of breastfeeding 7-12 Maternal age 77-30 Para 2    Review of Systems  General Not Present- Appetite Loss, Chills, Fatigue, Fever, Night Sweats, Weight Gain and Weight Loss. Skin Not Present- Change in Wart/Mole, Dryness, Hives, Jaundice, New Lesions, Non-Healing Wounds, Rash and Ulcer. HEENT Present- Seasonal Allergies and Wears glasses/contact lenses. Not Present- Earache,  Hearing Loss, Hoarseness, Nose Bleed, Oral Ulcers, Ringing in the Ears, Sinus Pain, Sore Throat, Visual Disturbances and Yellow Eyes. Respiratory Present- Snoring. Not Present- Bloody sputum, Chronic Cough, Difficulty Breathing and Wheezing. Breast Present- Breast Mass and Nipple Discharge. Not Present- Breast Pain and Skin Changes. Cardiovascular Not Present- Chest Pain, Difficulty Breathing Lying Down, Leg Cramps, Palpitations, Rapid Heart Rate, Shortness of Breath and Swelling of Extremities. Gastrointestinal Not Present- Abdominal Pain, Bloating, Bloody Stool, Change in Bowel Habits, Chronic diarrhea, Constipation, Difficulty Swallowing, Excessive gas, Gets full quickly at meals, Hemorrhoids, Indigestion, Nausea, Rectal Pain and Vomiting. Female Genitourinary Not Present- Frequency, Nocturia, Painful Urination, Pelvic Pain and Urgency. Musculoskeletal Not Present- Back Pain, Joint Pain, Joint Stiffness, Muscle Pain, Muscle Weakness and Swelling of Extremities. Neurological Not Present- Decreased Memory, Fainting, Headaches, Numbness, Seizures, Tingling, Tremor, Trouble walking and Weakness. Psychiatric Not Present- Anxiety, Bipolar, Change in Sleep Pattern, Depression, Fearful and Frequent crying. Endocrine Not Present- Cold Intolerance, Excessive Hunger, Hair Changes, Heat Intolerance, Hot flashes and New Diabetes. Hematology Not Present- Blood Thinners, Easy Bruising, Excessive bleeding, Gland problems, HIV and Persistent Infections.  Vitals  Weight: 187 lb Height: 66in Body Surface Area: 1.94 m Body Mass Index: 30.18 kg/m  Temp.: 97.32F(Temporal)  Pulse: 76 (Regular)  BP: 146/92 (Sitting, Left Arm, Standard)       Physical Exam  General Mental Status-Alert. General Appearance-Consistent with stated age. Hydration-Well hydrated. Voice-Normal.  Head and Neck Head-normocephalic, atraumatic with no lesions or palpable  masses. Trachea-midline. Thyroid Gland Characteristics - normal size and consistency.  Eye Eyeball - Bilateral-Extraocular movements intact. Sclera/Conjunctiva - Bilateral-No scleral icterus.  Chest and Lung Exam Chest and lung exam reveals -quiet, even and easy respiratory effort with no use of accessory muscles and on  auscultation, normal breath sounds, no adventitious sounds and normal vocal resonance. Inspection Chest Wall - Normal. Back - normal.  Breast Note: There is no palpable mass in either breast. There is no palpable axillary, supraclavicular, or cervical lymphadenopathy.   Cardiovascular Cardiovascular examination reveals -normal heart sounds, regular rate and rhythm with no murmurs and normal pedal pulses bilaterally.  Abdomen Inspection Inspection of the abdomen reveals - No Hernias. Skin - Scar - no surgical scars. Palpation/Percussion Palpation and Percussion of the abdomen reveal - Soft, Non Tender, No Rebound tenderness, No Rigidity (guarding) and No hepatosplenomegaly. Auscultation Auscultation of the abdomen reveals - Bowel sounds normal.  Neurologic Neurologic evaluation reveals -alert and oriented x 3 with no impairment of recent or remote memory. Mental Status-Normal.  Musculoskeletal Normal Exam - Left-Upper Extremity Strength Normal and Lower Extremity Strength Normal. Normal Exam - Right-Upper Extremity Strength Normal and Lower Extremity Strength Normal.  Lymphatic Head & Neck  General Head & Neck Lymphatics: Bilateral - Description - Normal. Axillary  General Axillary Region: Bilateral - Description - Normal. Tenderness - Non Tender. Femoral & Inguinal  Generalized Femoral & Inguinal Lymphatics: Bilateral - Description - Normal. Tenderness - Non Tender.    Assessment & Plan INTRADUCTAL PAPILLOMA OF BREAST, LEFT (D24.2) Impression: The patient appears to have an intraductal papilloma on the inner aspect of the left  breast. Because of its abnormal appearance and because of the discharge from the nipple I think it would be reasonable to remove the area. She would also like to have this done. I have discussed with her in detail the risks and benefits of the operation to remove the papilloma as well as some of the technical aspects and she understands and wishes to proceed. I will plan for a left breast radioactive seed localized lumpectomy Current Plans Pt Education - Breast Diseases: discussed with patient and provided information.

## 2016-08-19 NOTE — Discharge Instructions (Signed)

## 2016-08-20 ENCOUNTER — Encounter (HOSPITAL_BASED_OUTPATIENT_CLINIC_OR_DEPARTMENT_OTHER): Payer: Self-pay | Admitting: General Surgery

## 2016-12-09 DIAGNOSIS — R635 Abnormal weight gain: Secondary | ICD-10-CM | POA: Diagnosis not present

## 2016-12-09 DIAGNOSIS — I10 Essential (primary) hypertension: Secondary | ICD-10-CM | POA: Diagnosis not present

## 2017-04-30 DIAGNOSIS — I10 Essential (primary) hypertension: Secondary | ICD-10-CM | POA: Diagnosis not present

## 2017-07-01 DIAGNOSIS — Z6828 Body mass index (BMI) 28.0-28.9, adult: Secondary | ICD-10-CM | POA: Diagnosis not present

## 2017-07-01 DIAGNOSIS — Z01419 Encounter for gynecological examination (general) (routine) without abnormal findings: Secondary | ICD-10-CM | POA: Diagnosis not present

## 2017-07-01 DIAGNOSIS — Z1231 Encounter for screening mammogram for malignant neoplasm of breast: Secondary | ICD-10-CM | POA: Diagnosis not present

## 2017-08-24 IMAGING — MG DIGITAL DIAGNOSTIC UNILATERAL LEFT MAMMOGRAM
2 series · 2 of 2 positions shown · non-contrast
Comparison: Previous exam(s).

CLINICAL DATA: Evaluate marker placement after biopsy

EXAM:
DIAGNOSTIC LEFT MAMMOGRAM POST ULTRASOUND BIOPSY

[L CC]
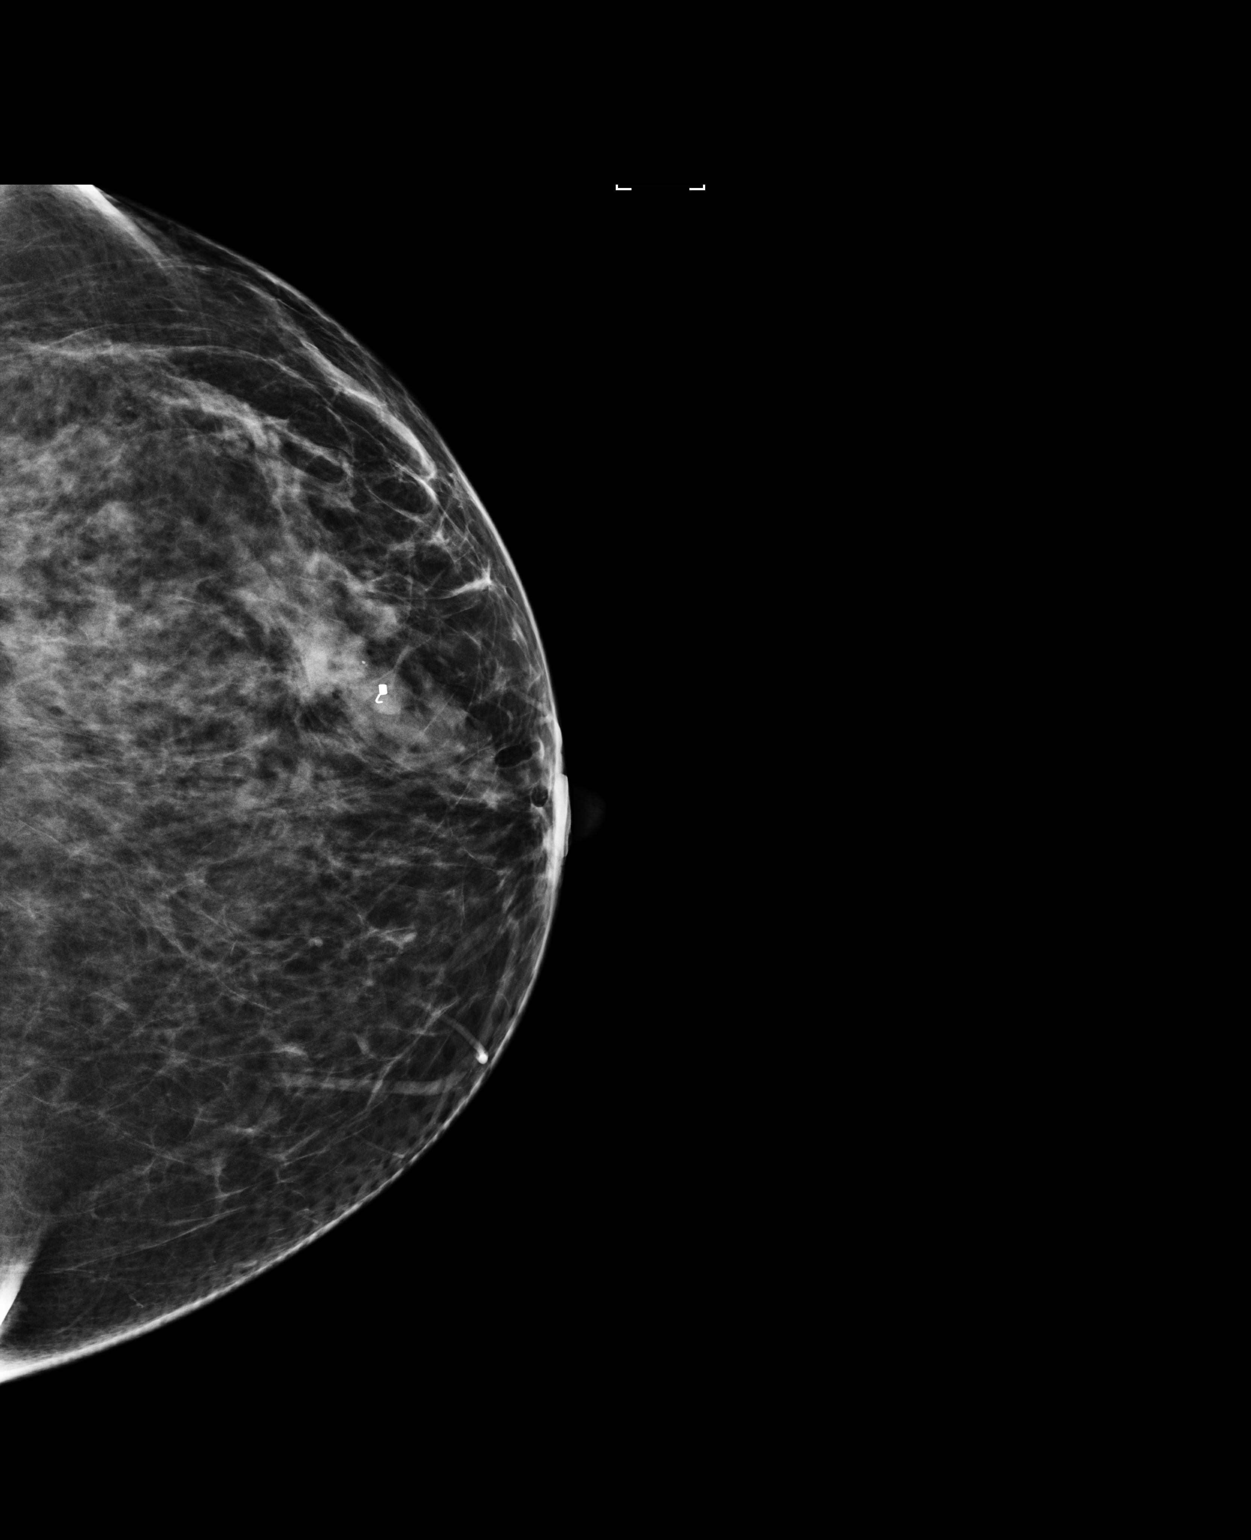

[L ML]
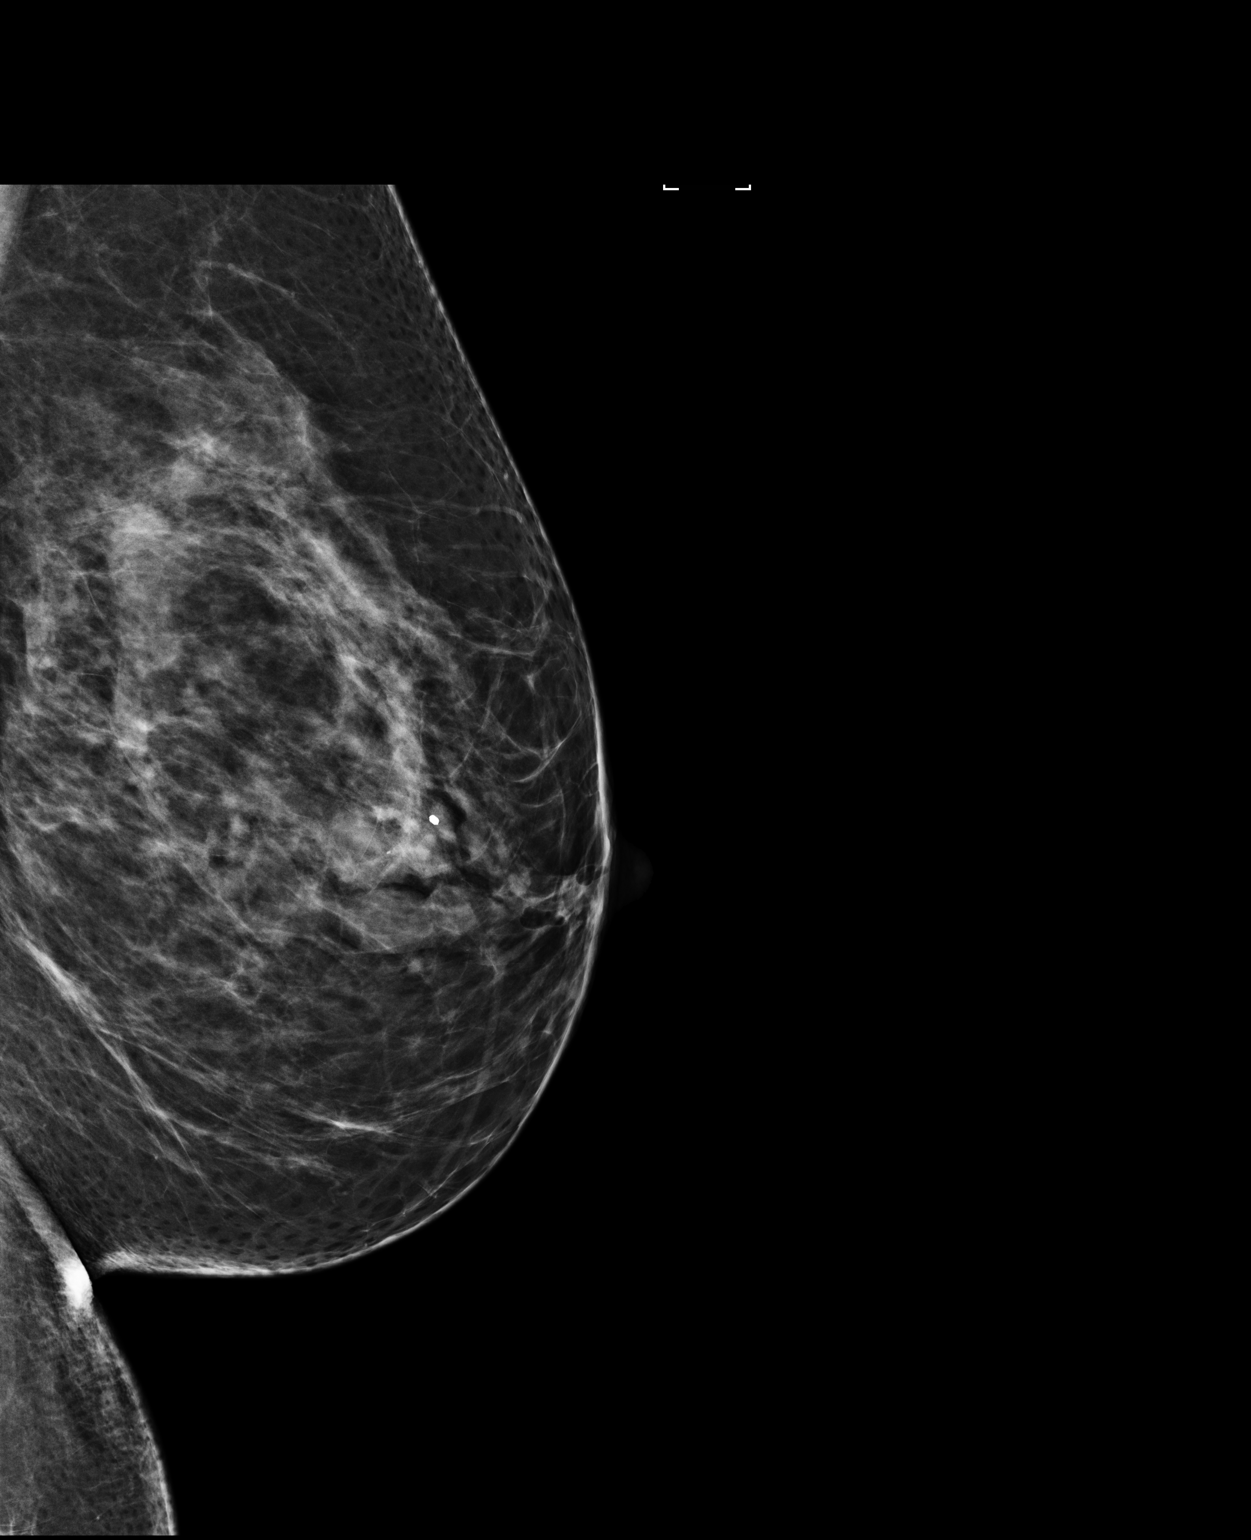

[2 of 2 positions shown; findings below may reference images not displayed]

FINDINGS: Mammographic images were obtained following ultrasound guided biopsy
of a probable left breast papilloma. The coil shaped marker is in
good position
IMPRESSION: Appropriate placement of biopsy marker

Final Assessment: Post Procedure Mammograms for Marker Placement

## 2017-08-24 IMAGING — US US BREAST BX W LOC DEV 1ST LESION IMG BX SPEC US GUIDE*L*
1 series · 12 of 12 positions shown · non-contrast
Comparison: Previous exam(s).

ADDENDUM:
Pathology revealed a ductal papilloma with usual ductal hyperplasia
in the left breast. This was found to be concordant by Dr. Sunders
Drapcand. Pathology results were discussed with the patient by
telephone. The patient reported doing well after the biopsy with
tenderness at the site. Post biopsy instructions and care were
reviewed and questions were answered. The patient was encouraged to
call The [REDACTED] for any additional
concerns. Surgical consultation has been arranged with Dr. Kursad Eapen
at [REDACTED] on July 21, 2016.

Pathology results reported by Asmita Schuman RN, BSN on 07/08/2016.
CLINICAL DATA: Intraductal left breast mass biopsy
EXAM:
ULTRASOUND GUIDED LEFT BREAST CORE NEEDLE BIOPSY

[Series 1: us breast bx w loc dev 1st lesion img bx spec us g · 0.07mm/px · 12 of 12 slices shown]
[im 1/12]
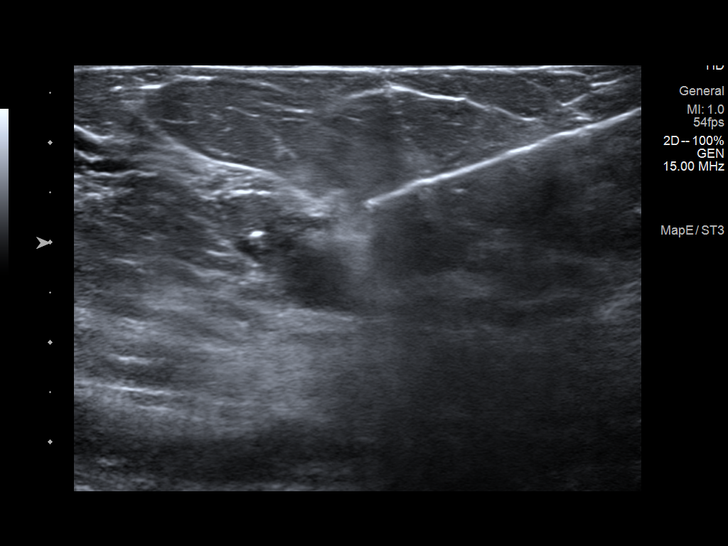
[im 2/12]
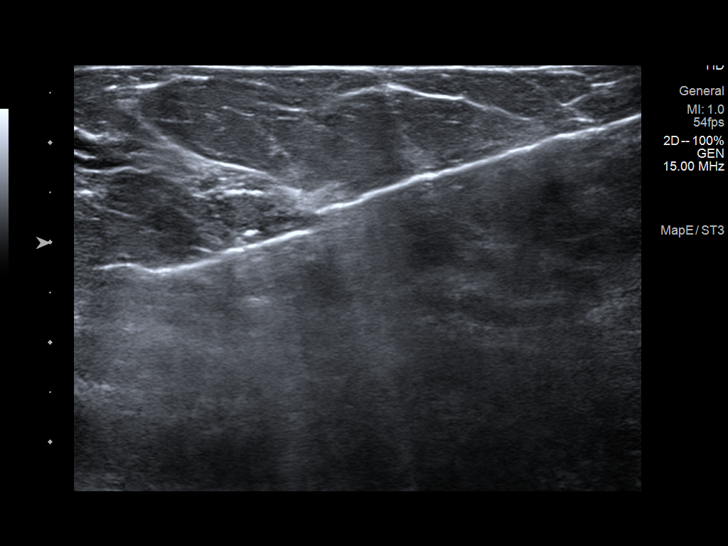
[im 3/12]
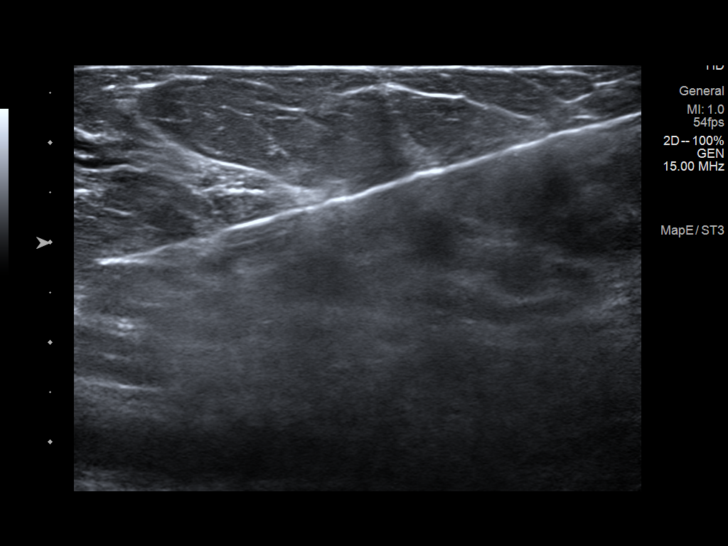
[im 4/12]
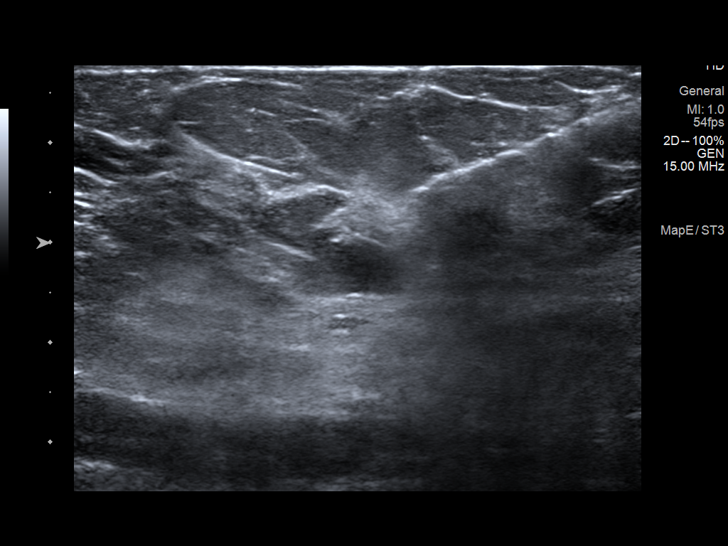
[im 5/12]
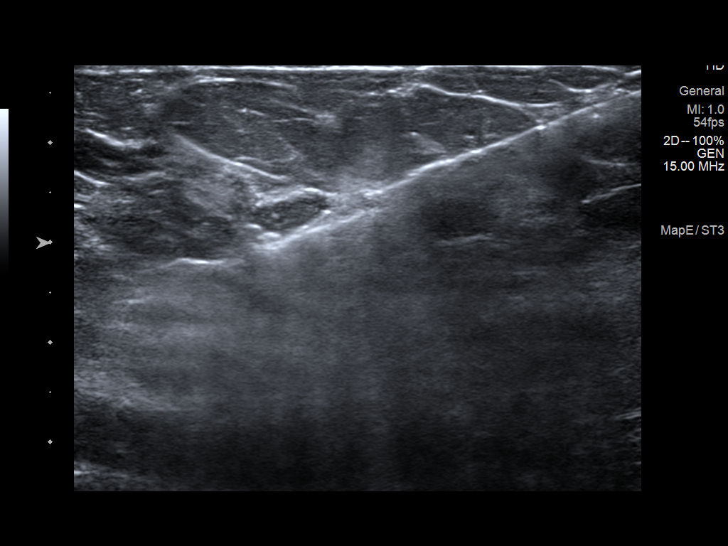
[im 6/12]
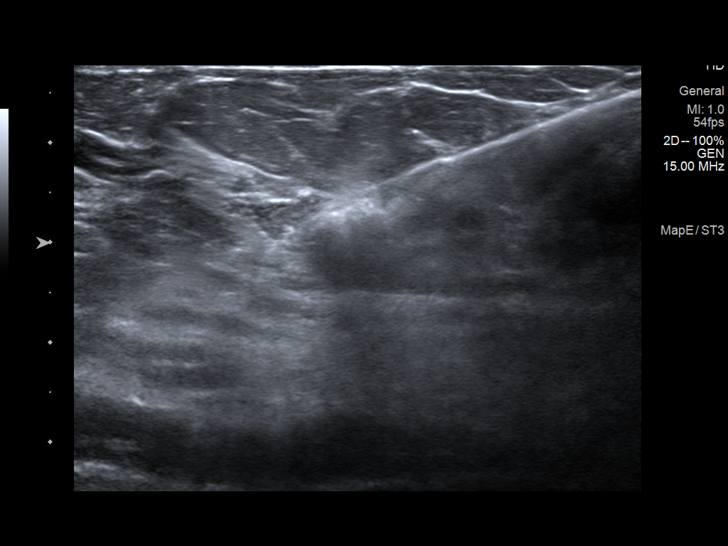
[im 7/12]
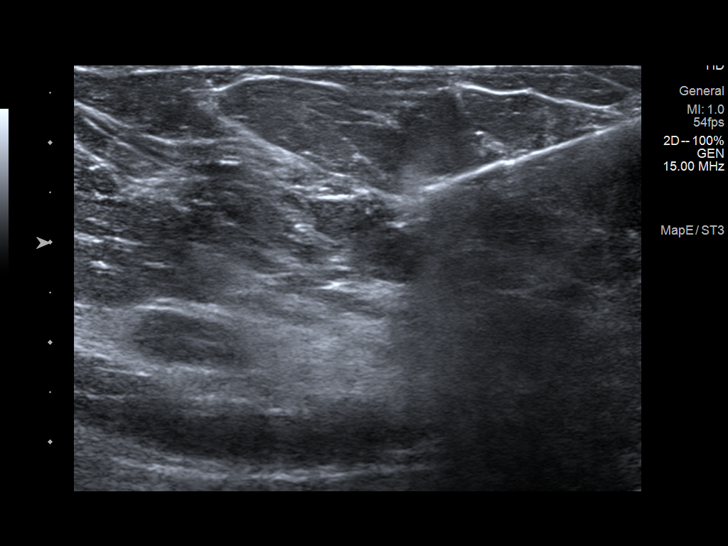
[im 8/12]
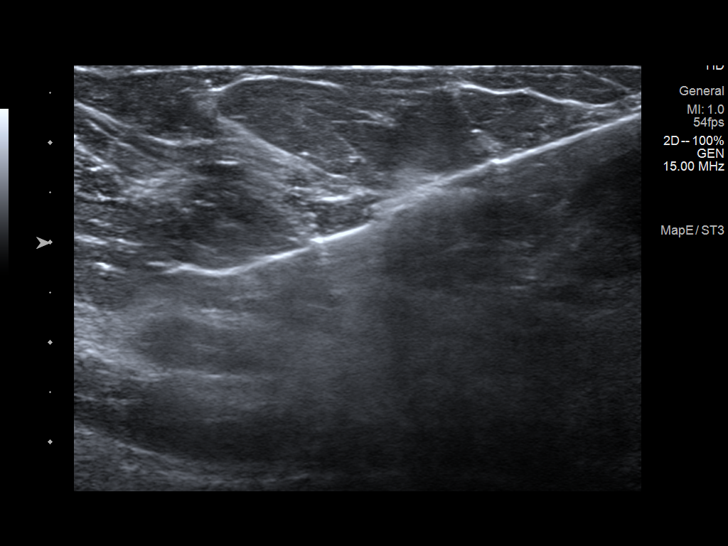
[im 9/12]
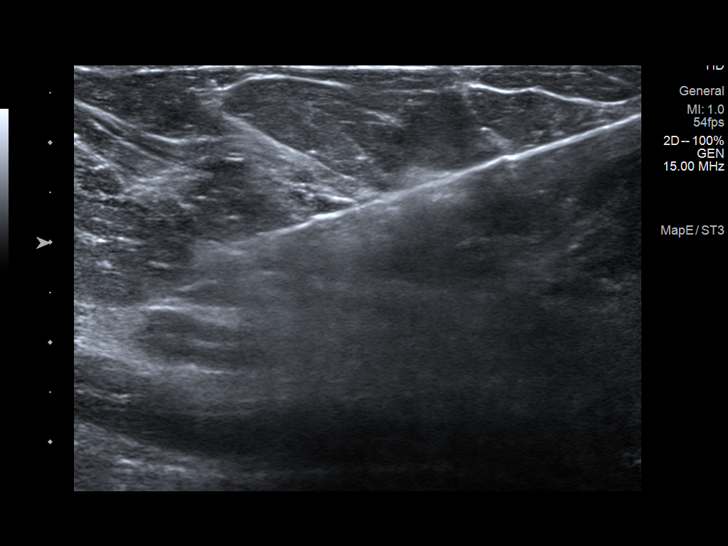
[im 10/12]
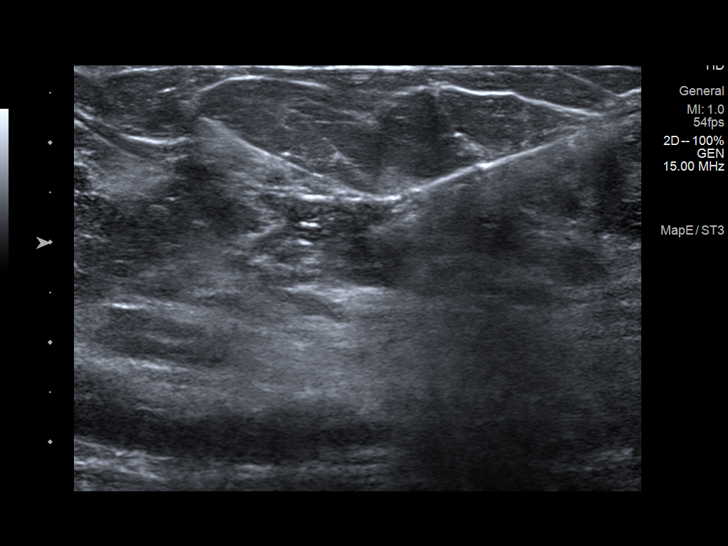
[im 11/12]
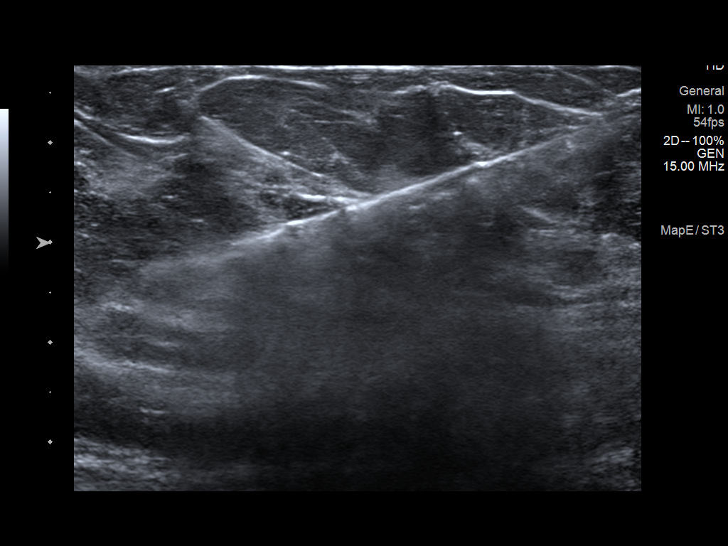
[im 12/12]
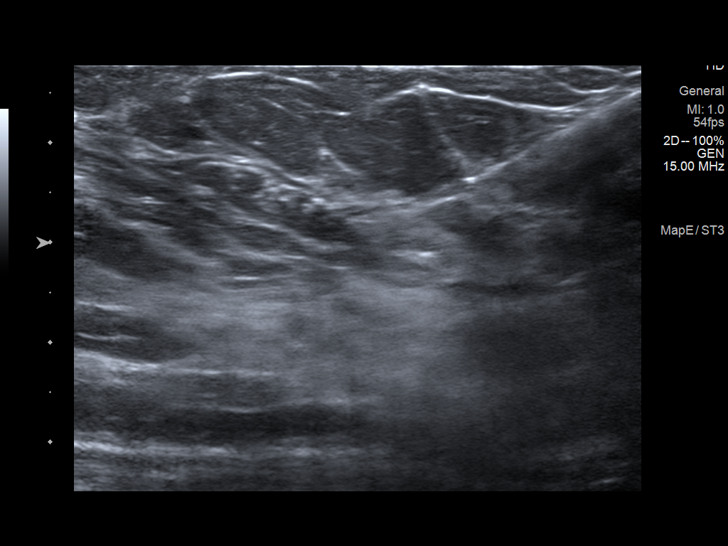

[12 of 12 positions shown; findings below may reference images not displayed]



Using sterile technique and 1% Lidocaine as local anesthetic, under
direct ultrasound visualization, a 12 gauge [place] device was
used to perform biopsy of an intraductal left breast mass using a
lateral approach. At the conclusion of the procedure a tissue marker
clip was deployed into the biopsy cavity. Follow up 2 view mammogram
was performed and dictated separately.
IMPRESSION: Ultrasound guided biopsy of an intraductal left breast mass. No
apparent complications.

## 2017-08-29 DIAGNOSIS — Z23 Encounter for immunization: Secondary | ICD-10-CM | POA: Diagnosis not present

## 2017-10-06 IMAGING — MG BREAST SURGICAL SPECIMEN
1 series · 1 of 1 positions shown · non-contrast
Comparison: Previous exam(s).

CLINICAL DATA: Biopsy proven intraductal papilloma in the left
breast.

EXAM:
SPECIMEN RADIOGRAPH OF THE LEFT BREAST

[L]
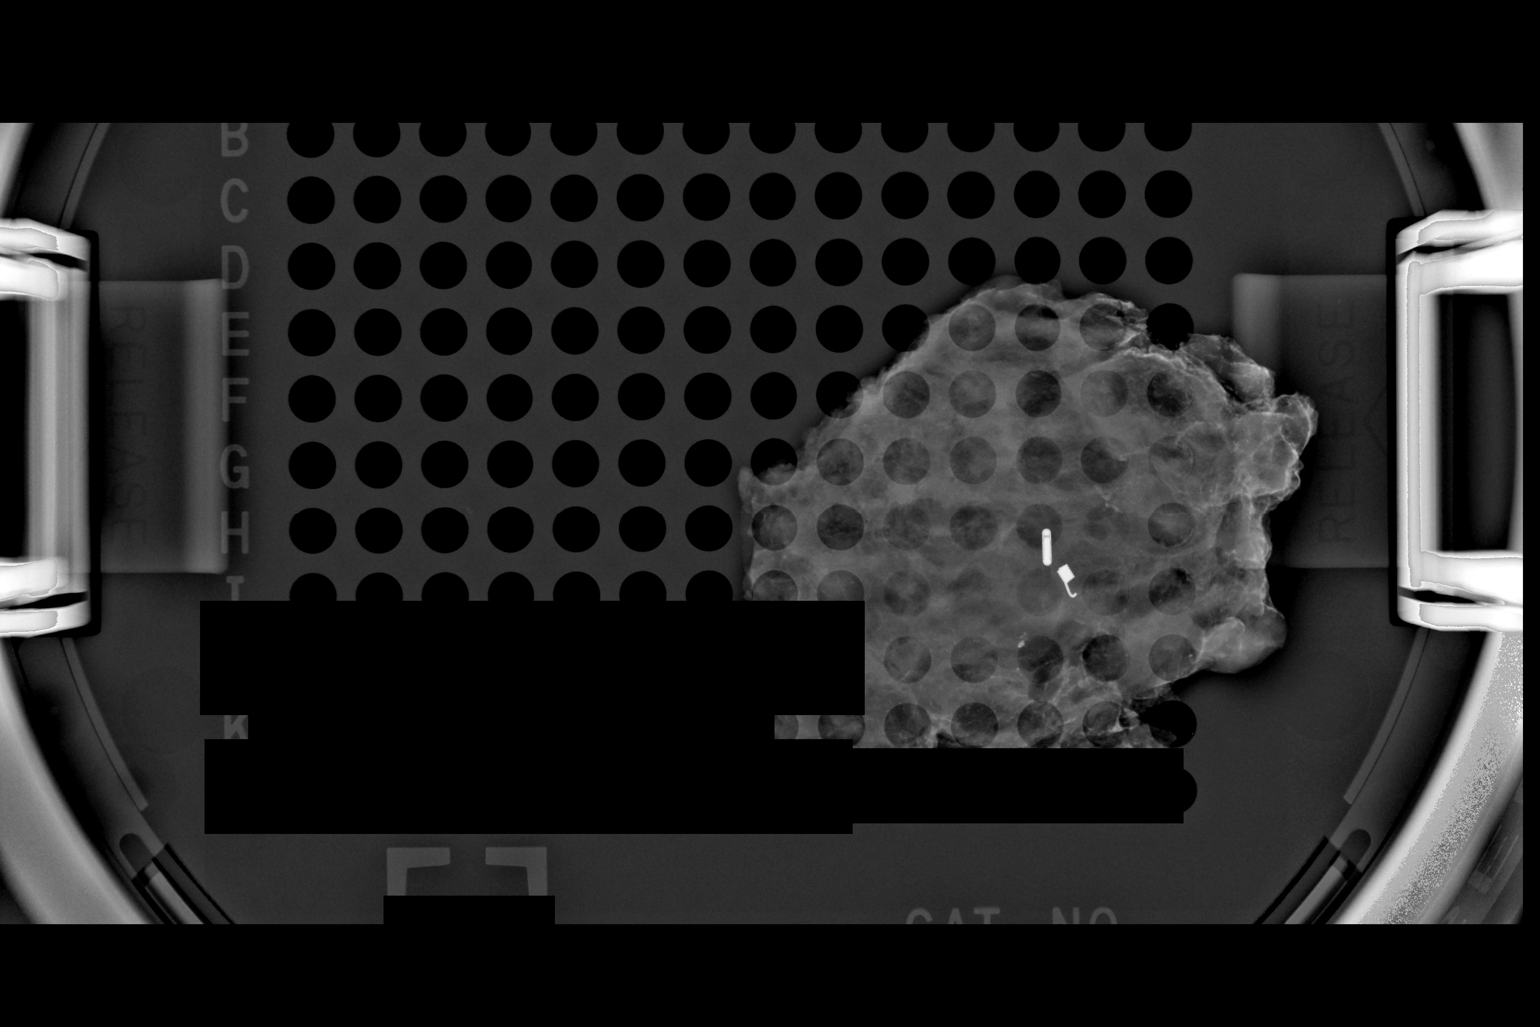

[1 of 1 positions shown; findings below may reference images not displayed]

FINDINGS: Status post excision of the left breast. The radioactive seed and
biopsy marker clip are present, completely intact, and were marked
for pathology.
IMPRESSION: Specimen radiograph of the left breast.

## 2017-11-15 DIAGNOSIS — J3489 Other specified disorders of nose and nasal sinuses: Secondary | ICD-10-CM | POA: Diagnosis not present

## 2018-02-16 DIAGNOSIS — Z0000 Encounter for general adult medical examination without abnormal findings: Secondary | ICD-10-CM | POA: Diagnosis not present

## 2018-02-16 DIAGNOSIS — I10 Essential (primary) hypertension: Secondary | ICD-10-CM | POA: Diagnosis not present

## 2018-02-16 DIAGNOSIS — Z23 Encounter for immunization: Secondary | ICD-10-CM | POA: Diagnosis not present

## 2018-04-01 DIAGNOSIS — K648 Other hemorrhoids: Secondary | ICD-10-CM | POA: Diagnosis not present

## 2018-04-01 DIAGNOSIS — Z1211 Encounter for screening for malignant neoplasm of colon: Secondary | ICD-10-CM | POA: Diagnosis not present

## 2018-04-01 DIAGNOSIS — K5730 Diverticulosis of large intestine without perforation or abscess without bleeding: Secondary | ICD-10-CM | POA: Diagnosis not present

## 2018-07-07 DIAGNOSIS — Z6828 Body mass index (BMI) 28.0-28.9, adult: Secondary | ICD-10-CM | POA: Diagnosis not present

## 2018-07-07 DIAGNOSIS — Z01419 Encounter for gynecological examination (general) (routine) without abnormal findings: Secondary | ICD-10-CM | POA: Diagnosis not present

## 2018-07-07 DIAGNOSIS — Z1231 Encounter for screening mammogram for malignant neoplasm of breast: Secondary | ICD-10-CM | POA: Diagnosis not present

## 2018-10-11 DIAGNOSIS — E663 Overweight: Secondary | ICD-10-CM | POA: Diagnosis not present

## 2018-10-11 DIAGNOSIS — E7800 Pure hypercholesterolemia, unspecified: Secondary | ICD-10-CM | POA: Diagnosis not present

## 2018-10-11 DIAGNOSIS — I10 Essential (primary) hypertension: Secondary | ICD-10-CM | POA: Diagnosis not present

## 2019-04-18 DIAGNOSIS — E669 Obesity, unspecified: Secondary | ICD-10-CM | POA: Diagnosis not present

## 2019-04-18 DIAGNOSIS — E7800 Pure hypercholesterolemia, unspecified: Secondary | ICD-10-CM | POA: Diagnosis not present

## 2019-04-18 DIAGNOSIS — I10 Essential (primary) hypertension: Secondary | ICD-10-CM | POA: Diagnosis not present

## 2019-08-21 DIAGNOSIS — Z01419 Encounter for gynecological examination (general) (routine) without abnormal findings: Secondary | ICD-10-CM | POA: Diagnosis not present

## 2019-08-21 DIAGNOSIS — N951 Menopausal and female climacteric states: Secondary | ICD-10-CM | POA: Diagnosis not present

## 2019-08-21 DIAGNOSIS — Z1231 Encounter for screening mammogram for malignant neoplasm of breast: Secondary | ICD-10-CM | POA: Diagnosis not present

## 2019-08-21 DIAGNOSIS — Z6829 Body mass index (BMI) 29.0-29.9, adult: Secondary | ICD-10-CM | POA: Diagnosis not present

## 2019-12-04 DIAGNOSIS — E7800 Pure hypercholesterolemia, unspecified: Secondary | ICD-10-CM | POA: Diagnosis not present

## 2019-12-04 DIAGNOSIS — I10 Essential (primary) hypertension: Secondary | ICD-10-CM | POA: Diagnosis not present

## 2019-12-04 DIAGNOSIS — E669 Obesity, unspecified: Secondary | ICD-10-CM | POA: Diagnosis not present

## 2020-01-19 DIAGNOSIS — E669 Obesity, unspecified: Secondary | ICD-10-CM | POA: Diagnosis not present

## 2020-01-19 DIAGNOSIS — I10 Essential (primary) hypertension: Secondary | ICD-10-CM | POA: Diagnosis not present

## 2020-07-03 DIAGNOSIS — Z724 Inappropriate diet and eating habits: Secondary | ICD-10-CM | POA: Diagnosis not present

## 2020-07-03 DIAGNOSIS — I10 Essential (primary) hypertension: Secondary | ICD-10-CM | POA: Diagnosis not present

## 2020-07-03 DIAGNOSIS — Z713 Dietary counseling and surveillance: Secondary | ICD-10-CM | POA: Diagnosis not present

## 2020-07-03 DIAGNOSIS — E785 Hyperlipidemia, unspecified: Secondary | ICD-10-CM | POA: Diagnosis not present

## 2020-07-03 DIAGNOSIS — Z723 Lack of physical exercise: Secondary | ICD-10-CM | POA: Diagnosis not present

## 2020-07-03 DIAGNOSIS — Z6829 Body mass index (BMI) 29.0-29.9, adult: Secondary | ICD-10-CM | POA: Diagnosis not present

## 2020-07-09 DIAGNOSIS — Z724 Inappropriate diet and eating habits: Secondary | ICD-10-CM | POA: Diagnosis not present

## 2020-07-09 DIAGNOSIS — Z713 Dietary counseling and surveillance: Secondary | ICD-10-CM | POA: Diagnosis not present

## 2020-07-09 DIAGNOSIS — E785 Hyperlipidemia, unspecified: Secondary | ICD-10-CM | POA: Diagnosis not present

## 2020-07-09 DIAGNOSIS — Z723 Lack of physical exercise: Secondary | ICD-10-CM | POA: Diagnosis not present

## 2020-07-09 DIAGNOSIS — I10 Essential (primary) hypertension: Secondary | ICD-10-CM | POA: Diagnosis not present

## 2020-07-18 DIAGNOSIS — E785 Hyperlipidemia, unspecified: Secondary | ICD-10-CM | POA: Diagnosis not present

## 2020-07-18 DIAGNOSIS — I10 Essential (primary) hypertension: Secondary | ICD-10-CM | POA: Diagnosis not present

## 2020-07-18 DIAGNOSIS — Z723 Lack of physical exercise: Secondary | ICD-10-CM | POA: Diagnosis not present

## 2020-07-18 DIAGNOSIS — Z724 Inappropriate diet and eating habits: Secondary | ICD-10-CM | POA: Diagnosis not present

## 2020-07-18 DIAGNOSIS — Z713 Dietary counseling and surveillance: Secondary | ICD-10-CM | POA: Diagnosis not present

## 2020-07-25 DIAGNOSIS — E785 Hyperlipidemia, unspecified: Secondary | ICD-10-CM | POA: Diagnosis not present

## 2020-07-25 DIAGNOSIS — Z723 Lack of physical exercise: Secondary | ICD-10-CM | POA: Diagnosis not present

## 2020-07-25 DIAGNOSIS — Z724 Inappropriate diet and eating habits: Secondary | ICD-10-CM | POA: Diagnosis not present

## 2020-07-25 DIAGNOSIS — I10 Essential (primary) hypertension: Secondary | ICD-10-CM | POA: Diagnosis not present

## 2020-07-25 DIAGNOSIS — Z713 Dietary counseling and surveillance: Secondary | ICD-10-CM | POA: Diagnosis not present

## 2020-07-30 DIAGNOSIS — E7800 Pure hypercholesterolemia, unspecified: Secondary | ICD-10-CM | POA: Diagnosis not present

## 2020-07-30 DIAGNOSIS — I10 Essential (primary) hypertension: Secondary | ICD-10-CM | POA: Diagnosis not present

## 2020-08-01 DIAGNOSIS — Z713 Dietary counseling and surveillance: Secondary | ICD-10-CM | POA: Diagnosis not present

## 2020-08-01 DIAGNOSIS — I10 Essential (primary) hypertension: Secondary | ICD-10-CM | POA: Diagnosis not present

## 2020-08-01 DIAGNOSIS — Z724 Inappropriate diet and eating habits: Secondary | ICD-10-CM | POA: Diagnosis not present

## 2020-08-01 DIAGNOSIS — E663 Overweight: Secondary | ICD-10-CM | POA: Diagnosis not present

## 2020-08-01 DIAGNOSIS — Z723 Lack of physical exercise: Secondary | ICD-10-CM | POA: Diagnosis not present

## 2020-08-01 DIAGNOSIS — E785 Hyperlipidemia, unspecified: Secondary | ICD-10-CM | POA: Diagnosis not present

## 2020-08-08 DIAGNOSIS — I10 Essential (primary) hypertension: Secondary | ICD-10-CM | POA: Diagnosis not present

## 2020-08-08 DIAGNOSIS — Z713 Dietary counseling and surveillance: Secondary | ICD-10-CM | POA: Diagnosis not present

## 2020-08-08 DIAGNOSIS — Z723 Lack of physical exercise: Secondary | ICD-10-CM | POA: Diagnosis not present

## 2020-08-08 DIAGNOSIS — E663 Overweight: Secondary | ICD-10-CM | POA: Diagnosis not present

## 2020-08-08 DIAGNOSIS — E785 Hyperlipidemia, unspecified: Secondary | ICD-10-CM | POA: Diagnosis not present

## 2020-08-08 DIAGNOSIS — Z724 Inappropriate diet and eating habits: Secondary | ICD-10-CM | POA: Diagnosis not present

## 2020-08-29 DIAGNOSIS — E785 Hyperlipidemia, unspecified: Secondary | ICD-10-CM | POA: Diagnosis not present

## 2020-08-29 DIAGNOSIS — Z724 Inappropriate diet and eating habits: Secondary | ICD-10-CM | POA: Diagnosis not present

## 2020-08-29 DIAGNOSIS — I10 Essential (primary) hypertension: Secondary | ICD-10-CM | POA: Diagnosis not present

## 2020-08-29 DIAGNOSIS — E663 Overweight: Secondary | ICD-10-CM | POA: Diagnosis not present

## 2020-08-29 DIAGNOSIS — Z723 Lack of physical exercise: Secondary | ICD-10-CM | POA: Diagnosis not present

## 2020-08-29 DIAGNOSIS — Z713 Dietary counseling and surveillance: Secondary | ICD-10-CM | POA: Diagnosis not present

## 2020-09-03 DIAGNOSIS — E785 Hyperlipidemia, unspecified: Secondary | ICD-10-CM | POA: Diagnosis not present

## 2020-09-03 DIAGNOSIS — E663 Overweight: Secondary | ICD-10-CM | POA: Diagnosis not present

## 2020-09-03 DIAGNOSIS — Z724 Inappropriate diet and eating habits: Secondary | ICD-10-CM | POA: Diagnosis not present

## 2020-09-03 DIAGNOSIS — Z723 Lack of physical exercise: Secondary | ICD-10-CM | POA: Diagnosis not present

## 2020-09-03 DIAGNOSIS — I10 Essential (primary) hypertension: Secondary | ICD-10-CM | POA: Diagnosis not present

## 2020-09-03 DIAGNOSIS — Z713 Dietary counseling and surveillance: Secondary | ICD-10-CM | POA: Diagnosis not present

## 2020-11-12 ENCOUNTER — Other Ambulatory Visit: Payer: Self-pay | Admitting: Obstetrics and Gynecology

## 2020-11-12 DIAGNOSIS — R928 Other abnormal and inconclusive findings on diagnostic imaging of breast: Secondary | ICD-10-CM

## 2020-11-25 ENCOUNTER — Other Ambulatory Visit: Payer: Self-pay

## 2020-11-25 ENCOUNTER — Other Ambulatory Visit: Payer: Self-pay | Admitting: Obstetrics and Gynecology

## 2020-11-25 ENCOUNTER — Ambulatory Visit
Admission: RE | Admit: 2020-11-25 | Discharge: 2020-11-25 | Disposition: A | Discharge status: Home / Self Care | Payer: Managed Care, Other (non HMO) | Source: Ambulatory Visit | Attending: Obstetrics and Gynecology | Admitting: Obstetrics and Gynecology

## 2020-11-25 ENCOUNTER — Ambulatory Visit
Admission: RE | Admit: 2020-11-25 | Discharge: 2020-11-25 | Disposition: A | Discharge status: Home / Self Care | Payer: BLUE CROSS/BLUE SHIELD | Source: Ambulatory Visit | Attending: Obstetrics and Gynecology | Admitting: Obstetrics and Gynecology

## 2020-11-25 DIAGNOSIS — R928 Other abnormal and inconclusive findings on diagnostic imaging of breast: Secondary | ICD-10-CM

## 2020-12-03 ENCOUNTER — Ambulatory Visit
Admission: RE | Admit: 2020-12-03 | Discharge: 2020-12-03 | Disposition: A | Discharge status: Home / Self Care | Payer: Managed Care, Other (non HMO) | Source: Ambulatory Visit | Attending: Obstetrics and Gynecology | Admitting: Obstetrics and Gynecology

## 2020-12-03 ENCOUNTER — Other Ambulatory Visit: Payer: Managed Care, Other (non HMO)

## 2020-12-03 ENCOUNTER — Other Ambulatory Visit: Payer: Self-pay

## 2020-12-03 DIAGNOSIS — R928 Other abnormal and inconclusive findings on diagnostic imaging of breast: Secondary | ICD-10-CM

## 2023-02-11 DIAGNOSIS — Z6832 Body mass index (BMI) 32.0-32.9, adult: Secondary | ICD-10-CM | POA: Diagnosis not present

## 2023-02-11 DIAGNOSIS — Z01419 Encounter for gynecological examination (general) (routine) without abnormal findings: Secondary | ICD-10-CM | POA: Diagnosis not present

## 2023-02-11 DIAGNOSIS — Z1231 Encounter for screening mammogram for malignant neoplasm of breast: Secondary | ICD-10-CM | POA: Diagnosis not present

## 2023-03-24 DIAGNOSIS — E669 Obesity, unspecified: Secondary | ICD-10-CM | POA: Diagnosis not present

## 2023-03-24 DIAGNOSIS — Z6831 Body mass index (BMI) 31.0-31.9, adult: Secondary | ICD-10-CM | POA: Diagnosis not present

## 2023-05-12 ENCOUNTER — Other Ambulatory Visit (HOSPITAL_BASED_OUTPATIENT_CLINIC_OR_DEPARTMENT_OTHER): Payer: Self-pay

## 2023-05-12 DIAGNOSIS — Z713 Dietary counseling and surveillance: Secondary | ICD-10-CM | POA: Diagnosis not present

## 2023-05-12 DIAGNOSIS — Z6832 Body mass index (BMI) 32.0-32.9, adult: Secondary | ICD-10-CM | POA: Diagnosis not present

## 2023-05-12 DIAGNOSIS — Z139 Encounter for screening, unspecified: Secondary | ICD-10-CM | POA: Diagnosis not present

## 2023-05-12 DIAGNOSIS — Z6831 Body mass index (BMI) 31.0-31.9, adult: Secondary | ICD-10-CM | POA: Diagnosis not present

## 2023-05-12 DIAGNOSIS — E669 Obesity, unspecified: Secondary | ICD-10-CM | POA: Diagnosis not present

## 2023-05-12 MED ORDER — WEGOVY 0.25 MG/0.5ML ~~LOC~~ SOAJ
0.2500 mg | SUBCUTANEOUS | 1 refills | Status: DC
  Filled 2023-05-12 – 2023-05-19 (×2): qty 2, 28d supply, fill #0

## 2023-05-13 ENCOUNTER — Other Ambulatory Visit: Payer: Self-pay

## 2023-05-14 ENCOUNTER — Other Ambulatory Visit (HOSPITAL_BASED_OUTPATIENT_CLINIC_OR_DEPARTMENT_OTHER): Payer: Self-pay

## 2023-05-14 ENCOUNTER — Encounter (HOSPITAL_BASED_OUTPATIENT_CLINIC_OR_DEPARTMENT_OTHER): Payer: Self-pay

## 2023-05-17 ENCOUNTER — Other Ambulatory Visit (HOSPITAL_BASED_OUTPATIENT_CLINIC_OR_DEPARTMENT_OTHER): Payer: Self-pay

## 2023-05-17 ENCOUNTER — Encounter (HOSPITAL_BASED_OUTPATIENT_CLINIC_OR_DEPARTMENT_OTHER): Payer: Self-pay

## 2023-05-18 ENCOUNTER — Other Ambulatory Visit (HOSPITAL_BASED_OUTPATIENT_CLINIC_OR_DEPARTMENT_OTHER): Payer: Self-pay

## 2023-05-19 ENCOUNTER — Other Ambulatory Visit (HOSPITAL_BASED_OUTPATIENT_CLINIC_OR_DEPARTMENT_OTHER): Payer: Self-pay

## 2023-06-09 ENCOUNTER — Other Ambulatory Visit (HOSPITAL_BASED_OUTPATIENT_CLINIC_OR_DEPARTMENT_OTHER): Payer: Self-pay

## 2023-06-09 MED ORDER — WEGOVY 0.5 MG/0.5ML ~~LOC~~ SOAJ
0.5000 mg | SUBCUTANEOUS | 1 refills | Status: DC
  Filled 2023-06-09: qty 2, 28d supply, fill #0

## 2023-07-07 ENCOUNTER — Other Ambulatory Visit (HOSPITAL_BASED_OUTPATIENT_CLINIC_OR_DEPARTMENT_OTHER): Payer: Self-pay

## 2023-07-07 MED ORDER — WEGOVY 1 MG/0.5ML ~~LOC~~ SOAJ
1.0000 mg | SUBCUTANEOUS | 0 refills | Status: DC
  Filled 2023-07-07: qty 2, 28d supply, fill #0

## 2023-08-03 ENCOUNTER — Other Ambulatory Visit (HOSPITAL_BASED_OUTPATIENT_CLINIC_OR_DEPARTMENT_OTHER): Payer: Self-pay

## 2023-08-03 MED ORDER — WEGOVY 1.7 MG/0.75ML ~~LOC~~ SOAJ
1.7000 mg | SUBCUTANEOUS | 0 refills | Status: DC
  Filled 2023-08-03: qty 3, 28d supply, fill #0

## 2023-08-04 DIAGNOSIS — E7800 Pure hypercholesterolemia, unspecified: Secondary | ICD-10-CM | POA: Diagnosis not present

## 2023-08-04 DIAGNOSIS — I10 Essential (primary) hypertension: Secondary | ICD-10-CM | POA: Diagnosis not present

## 2023-08-04 DIAGNOSIS — Z0000 Encounter for general adult medical examination without abnormal findings: Secondary | ICD-10-CM | POA: Diagnosis not present

## 2023-08-11 DIAGNOSIS — Z713 Dietary counseling and surveillance: Secondary | ICD-10-CM | POA: Diagnosis not present

## 2023-08-11 DIAGNOSIS — Z6831 Body mass index (BMI) 31.0-31.9, adult: Secondary | ICD-10-CM | POA: Diagnosis not present

## 2023-09-03 ENCOUNTER — Other Ambulatory Visit (HOSPITAL_BASED_OUTPATIENT_CLINIC_OR_DEPARTMENT_OTHER): Payer: Self-pay

## 2023-09-03 MED ORDER — WEGOVY 2.4 MG/0.75ML ~~LOC~~ SOAJ
2.4000 mg | SUBCUTANEOUS | 0 refills | Status: DC
  Filled 2023-09-03: qty 3, 28d supply, fill #0

## 2023-09-08 ENCOUNTER — Other Ambulatory Visit (HOSPITAL_BASED_OUTPATIENT_CLINIC_OR_DEPARTMENT_OTHER): Payer: Self-pay

## 2023-09-08 MED ORDER — WEGOVY 1.7 MG/0.75ML ~~LOC~~ SOAJ
1.7000 mg | SUBCUTANEOUS | 0 refills | Status: AC
  Filled 2023-09-08: qty 3, 28d supply, fill #0

## 2023-10-05 ENCOUNTER — Other Ambulatory Visit (HOSPITAL_BASED_OUTPATIENT_CLINIC_OR_DEPARTMENT_OTHER): Payer: Self-pay

## 2023-10-05 MED ORDER — WEGOVY 2.4 MG/0.75ML ~~LOC~~ SOAJ
2.4000 mg | SUBCUTANEOUS | 1 refills | Status: AC
  Filled 2023-10-05 – 2024-04-06 (×2): qty 3, 28d supply, fill #0
  Filled 2024-05-29: qty 3, 28d supply, fill #1

## 2023-10-06 ENCOUNTER — Other Ambulatory Visit (HOSPITAL_BASED_OUTPATIENT_CLINIC_OR_DEPARTMENT_OTHER): Payer: Self-pay

## 2023-10-08 ENCOUNTER — Other Ambulatory Visit (HOSPITAL_BASED_OUTPATIENT_CLINIC_OR_DEPARTMENT_OTHER): Payer: Self-pay

## 2023-10-14 ENCOUNTER — Other Ambulatory Visit (HOSPITAL_BASED_OUTPATIENT_CLINIC_OR_DEPARTMENT_OTHER): Payer: Self-pay

## 2023-10-14 DIAGNOSIS — Z6830 Body mass index (BMI) 30.0-30.9, adult: Secondary | ICD-10-CM | POA: Diagnosis not present

## 2023-10-14 DIAGNOSIS — Z713 Dietary counseling and surveillance: Secondary | ICD-10-CM | POA: Diagnosis not present

## 2023-10-14 MED ORDER — WEGOVY 2.4 MG/0.75ML ~~LOC~~ SOAJ
2.4000 mg | SUBCUTANEOUS | 1 refills | Status: DC
  Filled 2023-10-14 – 2023-11-08 (×2): qty 3, 28d supply, fill #0

## 2023-10-21 ENCOUNTER — Other Ambulatory Visit (HOSPITAL_BASED_OUTPATIENT_CLINIC_OR_DEPARTMENT_OTHER): Payer: Self-pay

## 2023-10-21 MED ORDER — WEGOVY 0.25 MG/0.5ML ~~LOC~~ SOAJ
0.2500 mg | SUBCUTANEOUS | 1 refills | Status: DC
  Filled 2023-10-21: qty 2, 28d supply, fill #0
  Filled 2023-11-30: qty 2, 28d supply, fill #1

## 2023-10-22 ENCOUNTER — Other Ambulatory Visit (HOSPITAL_BASED_OUTPATIENT_CLINIC_OR_DEPARTMENT_OTHER): Payer: Self-pay

## 2023-10-28 ENCOUNTER — Other Ambulatory Visit (HOSPITAL_BASED_OUTPATIENT_CLINIC_OR_DEPARTMENT_OTHER): Payer: Self-pay

## 2023-11-08 ENCOUNTER — Other Ambulatory Visit (HOSPITAL_BASED_OUTPATIENT_CLINIC_OR_DEPARTMENT_OTHER): Payer: Self-pay

## 2023-11-17 ENCOUNTER — Other Ambulatory Visit (HOSPITAL_BASED_OUTPATIENT_CLINIC_OR_DEPARTMENT_OTHER): Payer: Self-pay

## 2023-11-17 DIAGNOSIS — Z6830 Body mass index (BMI) 30.0-30.9, adult: Secondary | ICD-10-CM | POA: Diagnosis not present

## 2023-11-17 DIAGNOSIS — Z713 Dietary counseling and surveillance: Secondary | ICD-10-CM | POA: Diagnosis not present

## 2023-11-17 MED ORDER — WEGOVY 0.5 MG/0.5ML ~~LOC~~ SOAJ
0.5000 mg | SUBCUTANEOUS | 1 refills | Status: DC
  Filled 2023-11-17 – 2023-11-22 (×3): qty 2, 28d supply, fill #0

## 2023-11-22 ENCOUNTER — Other Ambulatory Visit (HOSPITAL_BASED_OUTPATIENT_CLINIC_OR_DEPARTMENT_OTHER): Payer: Self-pay

## 2023-11-30 ENCOUNTER — Other Ambulatory Visit (HOSPITAL_BASED_OUTPATIENT_CLINIC_OR_DEPARTMENT_OTHER): Payer: Self-pay

## 2023-12-16 ENCOUNTER — Other Ambulatory Visit: Payer: Self-pay

## 2023-12-16 ENCOUNTER — Other Ambulatory Visit (HOSPITAL_BASED_OUTPATIENT_CLINIC_OR_DEPARTMENT_OTHER): Payer: Self-pay

## 2023-12-16 MED ORDER — WEGOVY 1 MG/0.5ML ~~LOC~~ SOAJ
1.0000 mg | SUBCUTANEOUS | 1 refills | Status: DC
  Filled 2023-12-16: qty 2, 28d supply, fill #0

## 2024-01-18 ENCOUNTER — Other Ambulatory Visit (HOSPITAL_BASED_OUTPATIENT_CLINIC_OR_DEPARTMENT_OTHER): Payer: Self-pay

## 2024-01-18 MED ORDER — WEGOVY 1.7 MG/0.75ML ~~LOC~~ SOAJ
1.7000 mg | SUBCUTANEOUS | 1 refills | Status: DC
  Filled 2024-01-18: qty 3, 28d supply, fill #0

## 2024-01-26 DIAGNOSIS — Z713 Dietary counseling and surveillance: Secondary | ICD-10-CM | POA: Diagnosis not present

## 2024-01-26 DIAGNOSIS — Z6829 Body mass index (BMI) 29.0-29.9, adult: Secondary | ICD-10-CM | POA: Diagnosis not present

## 2024-01-31 DIAGNOSIS — H40013 Open angle with borderline findings, low risk, bilateral: Secondary | ICD-10-CM | POA: Diagnosis not present

## 2024-02-08 ENCOUNTER — Other Ambulatory Visit (HOSPITAL_BASED_OUTPATIENT_CLINIC_OR_DEPARTMENT_OTHER): Payer: Self-pay

## 2024-02-08 MED ORDER — WEGOVY 2.4 MG/0.75ML ~~LOC~~ SOAJ
2.4000 mg | SUBCUTANEOUS | 3 refills | Status: AC
  Filled 2024-02-08: qty 3, 28d supply, fill #0
  Filled 2024-07-22 (×2): qty 3, 28d supply, fill #1
  Filled 2024-08-19: qty 3, 28d supply, fill #2
  Filled 2024-09-15: qty 3, 28d supply, fill #3

## 2024-02-23 DIAGNOSIS — Z1231 Encounter for screening mammogram for malignant neoplasm of breast: Secondary | ICD-10-CM | POA: Diagnosis not present

## 2024-02-23 DIAGNOSIS — Z6830 Body mass index (BMI) 30.0-30.9, adult: Secondary | ICD-10-CM | POA: Diagnosis not present

## 2024-02-23 DIAGNOSIS — Z01419 Encounter for gynecological examination (general) (routine) without abnormal findings: Secondary | ICD-10-CM | POA: Diagnosis not present

## 2024-03-06 ENCOUNTER — Other Ambulatory Visit (HOSPITAL_BASED_OUTPATIENT_CLINIC_OR_DEPARTMENT_OTHER): Payer: Self-pay

## 2024-03-06 MED ORDER — WEGOVY 2.4 MG/0.75ML ~~LOC~~ SOAJ
2.4000 mg | SUBCUTANEOUS | 0 refills | Status: AC
  Filled 2024-03-06: qty 3, 28d supply, fill #0

## 2024-04-05 DIAGNOSIS — Z713 Dietary counseling and surveillance: Secondary | ICD-10-CM | POA: Diagnosis not present

## 2024-04-05 DIAGNOSIS — Z6829 Body mass index (BMI) 29.0-29.9, adult: Secondary | ICD-10-CM | POA: Diagnosis not present

## 2024-04-06 ENCOUNTER — Other Ambulatory Visit (HOSPITAL_BASED_OUTPATIENT_CLINIC_OR_DEPARTMENT_OTHER): Payer: Self-pay

## 2024-04-10 ENCOUNTER — Other Ambulatory Visit (HOSPITAL_BASED_OUTPATIENT_CLINIC_OR_DEPARTMENT_OTHER): Payer: Self-pay

## 2024-04-10 MED ORDER — WEGOVY 2.4 MG/0.75ML ~~LOC~~ SOAJ
2.4000 mg | SUBCUTANEOUS | 0 refills | Status: AC
  Filled 2024-05-01: qty 3, 28d supply, fill #0

## 2024-05-01 ENCOUNTER — Other Ambulatory Visit: Payer: Self-pay

## 2024-05-04 ENCOUNTER — Other Ambulatory Visit (HOSPITAL_BASED_OUTPATIENT_CLINIC_OR_DEPARTMENT_OTHER): Payer: Self-pay

## 2024-06-13 ENCOUNTER — Other Ambulatory Visit (HOSPITAL_BASED_OUTPATIENT_CLINIC_OR_DEPARTMENT_OTHER): Payer: Self-pay

## 2024-06-13 DIAGNOSIS — Z6829 Body mass index (BMI) 29.0-29.9, adult: Secondary | ICD-10-CM | POA: Diagnosis not present

## 2024-06-13 DIAGNOSIS — Z713 Dietary counseling and surveillance: Secondary | ICD-10-CM | POA: Diagnosis not present

## 2024-06-13 MED ORDER — WEGOVY 2.4 MG/0.75ML ~~LOC~~ SOAJ
2.4000 mg | SUBCUTANEOUS | 1 refills | Status: AC
  Filled 2024-06-13 – 2024-06-24 (×2): qty 3, 28d supply, fill #0

## 2024-06-25 ENCOUNTER — Other Ambulatory Visit (HOSPITAL_BASED_OUTPATIENT_CLINIC_OR_DEPARTMENT_OTHER): Payer: Self-pay

## 2024-07-22 ENCOUNTER — Other Ambulatory Visit: Payer: Self-pay

## 2024-07-22 ENCOUNTER — Other Ambulatory Visit (HOSPITAL_BASED_OUTPATIENT_CLINIC_OR_DEPARTMENT_OTHER): Payer: Self-pay

## 2024-08-23 ENCOUNTER — Other Ambulatory Visit (HOSPITAL_BASED_OUTPATIENT_CLINIC_OR_DEPARTMENT_OTHER): Payer: Self-pay

## 2024-08-28 DIAGNOSIS — Z713 Dietary counseling and surveillance: Secondary | ICD-10-CM | POA: Diagnosis not present

## 2024-08-28 DIAGNOSIS — Z6829 Body mass index (BMI) 29.0-29.9, adult: Secondary | ICD-10-CM | POA: Diagnosis not present

## 2024-08-30 DIAGNOSIS — Z0000 Encounter for general adult medical examination without abnormal findings: Secondary | ICD-10-CM | POA: Diagnosis not present

## 2024-08-30 DIAGNOSIS — R739 Hyperglycemia, unspecified: Secondary | ICD-10-CM | POA: Diagnosis not present

## 2024-08-30 DIAGNOSIS — E7800 Pure hypercholesterolemia, unspecified: Secondary | ICD-10-CM | POA: Diagnosis not present

## 2024-10-02 ENCOUNTER — Other Ambulatory Visit (HOSPITAL_BASED_OUTPATIENT_CLINIC_OR_DEPARTMENT_OTHER): Payer: Self-pay
